# Patient Record
Sex: Female | Born: 1960 | Race: Black or African American | Hispanic: No | Marital: Married | Smoking: Never smoker
Health system: Southern US, Community
[De-identification: ages and names within clinical notes are randomized; demographics above are authoritative.]

## PROBLEM LIST (undated history)

## (undated) DIAGNOSIS — K810 Acute cholecystitis: Secondary | ICD-10-CM

## (undated) DIAGNOSIS — E785 Hyperlipidemia, unspecified: Secondary | ICD-10-CM

## (undated) DIAGNOSIS — K219 Gastro-esophageal reflux disease without esophagitis: Secondary | ICD-10-CM

## (undated) DIAGNOSIS — I10 Essential (primary) hypertension: Secondary | ICD-10-CM

## (undated) HISTORY — PX: TUBAL LIGATION: SHX77

---

## 2018-10-22 ENCOUNTER — Encounter (HOSPITAL_COMMUNITY): Payer: Self-pay | Admitting: Internal Medicine

## 2018-10-22 ENCOUNTER — Inpatient Hospital Stay (HOSPITAL_COMMUNITY)
Admission: RE | Admit: 2018-10-22 | Discharge: 2018-10-25 | DRG: 419 | Disposition: A | Discharge status: Home / Self Care | Payer: BC Managed Care – PPO | Source: Other Acute Inpatient Hospital | Attending: Internal Medicine | Admitting: Internal Medicine

## 2018-10-22 DIAGNOSIS — K8050 Calculus of bile duct without cholangitis or cholecystitis without obstruction: Secondary | ICD-10-CM

## 2018-10-22 DIAGNOSIS — Z79899 Other long term (current) drug therapy: Secondary | ICD-10-CM

## 2018-10-22 DIAGNOSIS — Z885 Allergy status to narcotic agent status: Secondary | ICD-10-CM

## 2018-10-22 DIAGNOSIS — K660 Peritoneal adhesions (postprocedural) (postinfection): Secondary | ICD-10-CM | POA: Diagnosis present

## 2018-10-22 DIAGNOSIS — K8062 Calculus of gallbladder and bile duct with acute cholecystitis without obstruction: Principal | ICD-10-CM | POA: Diagnosis present

## 2018-10-22 DIAGNOSIS — Z20828 Contact with and (suspected) exposure to other viral communicable diseases: Secondary | ICD-10-CM | POA: Diagnosis present

## 2018-10-22 DIAGNOSIS — Z419 Encounter for procedure for purposes other than remedying health state, unspecified: Secondary | ICD-10-CM

## 2018-10-22 DIAGNOSIS — K810 Acute cholecystitis: Secondary | ICD-10-CM | POA: Diagnosis not present

## 2018-10-22 DIAGNOSIS — K828 Other specified diseases of gallbladder: Secondary | ICD-10-CM | POA: Diagnosis present

## 2018-10-22 DIAGNOSIS — K5909 Other constipation: Secondary | ICD-10-CM | POA: Diagnosis present

## 2018-10-22 DIAGNOSIS — K219 Gastro-esophageal reflux disease without esophagitis: Secondary | ICD-10-CM | POA: Diagnosis present

## 2018-10-22 DIAGNOSIS — R112 Nausea with vomiting, unspecified: Secondary | ICD-10-CM | POA: Diagnosis present

## 2018-10-22 DIAGNOSIS — I10 Essential (primary) hypertension: Secondary | ICD-10-CM | POA: Diagnosis present

## 2018-10-22 DIAGNOSIS — D649 Anemia, unspecified: Secondary | ICD-10-CM | POA: Diagnosis not present

## 2018-10-22 DIAGNOSIS — Z9851 Tubal ligation status: Secondary | ICD-10-CM | POA: Diagnosis not present

## 2018-10-22 DIAGNOSIS — K8042 Calculus of bile duct with acute cholecystitis without obstruction: Secondary | ICD-10-CM | POA: Diagnosis not present

## 2018-10-22 DIAGNOSIS — E785 Hyperlipidemia, unspecified: Secondary | ICD-10-CM | POA: Diagnosis present

## 2018-10-22 DIAGNOSIS — K819 Cholecystitis, unspecified: Secondary | ICD-10-CM

## 2018-10-22 DIAGNOSIS — K5900 Constipation, unspecified: Secondary | ICD-10-CM | POA: Diagnosis present

## 2018-10-22 HISTORY — DX: Hyperlipidemia, unspecified: E78.5

## 2018-10-22 HISTORY — DX: Acute cholecystitis: K81.0

## 2018-10-22 HISTORY — DX: Gastro-esophageal reflux disease without esophagitis: K21.9

## 2018-10-22 HISTORY — DX: Essential (primary) hypertension: I10

## 2018-10-22 LAB — CBC WITH DIFFERENTIAL/PLATELET
Abs Immature Granulocytes: 0.03 10*3/uL (ref 0.00–0.07)
Basophils Absolute: 0 10*3/uL (ref 0.0–0.1)
Basophils Relative: 0 %
Eosinophils Absolute: 0 10*3/uL (ref 0.0–0.5)
Eosinophils Relative: 0 %
HCT: 38.5 % (ref 36.0–46.0)
Hemoglobin: 13 g/dL (ref 12.0–15.0)
Immature Granulocytes: 0 %
Lymphocytes Relative: 15 %
Lymphs Abs: 1.2 10*3/uL (ref 0.7–4.0)
MCH: 32.2 pg (ref 26.0–34.0)
MCHC: 33.8 g/dL (ref 30.0–36.0)
MCV: 95.3 fL (ref 80.0–100.0)
Monocytes Absolute: 0.8 10*3/uL (ref 0.1–1.0)
Monocytes Relative: 9 %
Neutro Abs: 6.3 10*3/uL (ref 1.7–7.7)
Neutrophils Relative %: 76 %
Platelets: 226 10*3/uL (ref 150–400)
RBC: 4.04 MIL/uL (ref 3.87–5.11)
RDW: 13 % (ref 11.5–15.5)
WBC: 8.4 10*3/uL (ref 4.0–10.5)
nRBC: 0 % (ref 0.0–0.2)

## 2018-10-22 LAB — COMPREHENSIVE METABOLIC PANEL
ALT: 345 U/L — ABNORMAL HIGH (ref 0–44)
AST: 161 U/L — ABNORMAL HIGH (ref 15–41)
Albumin: 3.8 g/dL (ref 3.5–5.0)
Alkaline Phosphatase: 207 U/L — ABNORMAL HIGH (ref 38–126)
Anion gap: 12 (ref 5–15)
BUN: 10 mg/dL (ref 6–20)
CO2: 26 mmol/L (ref 22–32)
Calcium: 9.1 mg/dL (ref 8.9–10.3)
Chloride: 100 mmol/L (ref 98–111)
Creatinine, Ser: 0.96 mg/dL (ref 0.44–1.00)
GFR calc Af Amer: >60 mL/min (ref >60)
GFR calc non Af Amer: >60 mL/min (ref >60)
Glucose, Bld: 103 mg/dL — ABNORMAL HIGH (ref 70–99)
Potassium: 3.8 mmol/L (ref 3.5–5.1)
Sodium: 138 mmol/L (ref 135–145)
Total Bilirubin: 0.9 mg/dL (ref 0.3–1.2)
Total Protein: 6.5 g/dL (ref 6.5–8.1)

## 2018-10-22 LAB — HIV ANTIBODY (ROUTINE TESTING W REFLEX): HIV Screen 4th Generation wRfx: NONREACTIVE

## 2018-10-22 MED ORDER — ACETAMINOPHEN 325 MG PO TABS: 650.0000 mg | ORAL_TABLET | Freq: Four times a day (QID) | ORAL | Status: DC | PRN

## 2018-10-22 MED ORDER — PANTOPRAZOLE SODIUM 40 MG IV SOLR
40.0000 mg | INTRAVENOUS | Status: DC
  Administered 2018-10-22 – 2018-10-24 (×3): 40 mg via INTRAVENOUS
  Filled 2018-10-22 (×3): qty 40

## 2018-10-22 MED ORDER — SODIUM CHLORIDE 0.9 % IV SOLN
1.0000 g | Freq: Three times a day (TID) | INTRAVENOUS | Status: DC
  Administered 2018-10-22 – 2018-10-24 (×5): 1 g via INTRAVENOUS
  Filled 2018-10-22 (×9): qty 1

## 2018-10-22 MED ORDER — DEXTROSE-NACL 5-0.9 % IV SOLN
INTRAVENOUS | Status: DC
  Administered 2018-10-22 – 2018-10-25 (×6): via INTRAVENOUS

## 2018-10-22 MED ORDER — ONDANSETRON HCL 4 MG/2ML IJ SOLN
4.0000 mg | Freq: Four times a day (QID) | INTRAMUSCULAR | Status: DC | PRN
  Administered 2018-10-23 – 2018-10-24 (×2): 4 mg via INTRAVENOUS
  Filled 2018-10-22 (×2): qty 2

## 2018-10-22 MED ORDER — HEPARIN SODIUM (PORCINE) 5000 UNIT/ML IJ SOLN
5000.0000 [IU] | Freq: Three times a day (TID) | INTRAMUSCULAR | Status: DC
  Administered 2018-10-22 – 2018-10-23 (×2): 5000 [IU] via SUBCUTANEOUS
  Filled 2018-10-22 (×2): qty 1

## 2018-10-22 MED ORDER — ONDANSETRON HCL 4 MG PO TABS: 4.0000 mg | ORAL_TABLET | Freq: Four times a day (QID) | ORAL | Status: DC | PRN

## 2018-10-22 MED ORDER — MORPHINE SULFATE (PF) 2 MG/ML IV SOLN
2.0000 mg | INTRAVENOUS | Status: DC | PRN
  Administered 2018-10-23 – 2018-10-24 (×3): 2 mg via INTRAVENOUS
  Filled 2018-10-22 (×3): qty 1

## 2018-10-22 MED ORDER — ACETAMINOPHEN 650 MG RE SUPP: 650.0000 mg | Freq: Four times a day (QID) | RECTAL | Status: DC | PRN

## 2018-10-22 NOTE — Consult Note (Signed)
Reason for Consult: Cholecystitis Referring Physician: Jonelle Sidle MD   Note  Tina Mccoy is an 58 y.o. female.  HPI:  patient sent from [facility] rocking him and accepted by [facility] for acute cholecystitis.  She was evaluated there and was found to have elevated LFTs and a CT scan which showed acute cholecystitis.  She relates a 2-day history of abdominal pain, nausea and vomiting.  The pain is was in her epigastrium but now is better.  She had mild elevations of her LFTs.  She is pain-free currently.   History reviewed. No pertinent past medical history.  History reviewed. No pertinent surgical history.  History reviewed. No pertinent family history.  Social History:  reports that she has never smoked. She has never used smokeless tobacco. No history on file for alcohol and drug.  Allergies: Not on File  Medications: I have reviewed the patient's current medications.  Results for orders placed or performed during the hospital encounter of 10/22/18 (from the past 48 hour(s))  CBC with Differential/Platelet     Status: None   Collection Time: 10/22/18  7:25 PM  Result Value Ref Range   WBC 8.4 4.0 - 10.5 K/uL   RBC 4.04 3.87 - 5.11 MIL/uL   Hemoglobin 13.0 12.0 - 15.0 g/dL   HCT 38.5 36.0 - 46.0 %   MCV 95.3 80.0 - 100.0 fL   MCH 32.2 26.0 - 34.0 pg   MCHC 33.8 30.0 - 36.0 g/dL   RDW 13.0 11.5 - 15.5 %   Platelets 226 150 - 400 K/uL   nRBC 0.0 0.0 - 0.2 %   Neutrophils Relative % 76 %   Neutro Abs 6.3 1.7 - 7.7 K/uL   Lymphocytes Relative 15 %   Lymphs Abs 1.2 0.7 - 4.0 K/uL   Monocytes Relative 9 %   Monocytes Absolute 0.8 0.1 - 1.0 K/uL   Eosinophils Relative 0 %   Eosinophils Absolute 0.0 0.0 - 0.5 K/uL   Basophils Relative 0 %   Basophils Absolute 0.0 0.0 - 0.1 K/uL   Immature Granulocytes 0 %   Abs Immature Granulocytes 0.03 0.00 - 0.07 K/uL    Comment: Performed at [facility], [address], [place], [place] [address]  Comprehensive metabolic panel      Status: Abnormal   Collection Time: 10/22/18  7:25 PM  Result Value Ref Range   Sodium 138 135 - 145 mmol/L   Potassium 3.8 3.5 - 5.1 mmol/L   Chloride 100 98 - 111 mmol/L   CO2 26 22 - 32 mmol/L   Glucose, Bld 103 (H) 70 - 99 mg/dL   BUN 10 6 - 20 mg/dL   Creatinine, Ser 0.96 0.44 - 1.00 mg/dL   Calcium 9.1 8.9 - 10.3 mg/dL   Total Protein 6.5 6.5 - 8.1 g/dL   Albumin 3.8 3.5 - 5.0 g/dL   AST 161 (H) 15 - 41 U/L   ALT 345 (H) 0 - 44 U/L   Alkaline Phosphatase 207 (H) 38 - 126 U/L   Total Bilirubin 0.9 0.3 - 1.2 mg/dL   GFR calc non Af Amer >60 >60 mL/min   GFR calc Af Amer >60 >60 mL/min   Anion gap 12 5 - 15    Comment: Performed at [facility] [address], [place], [place] [address]  HIV Antibody (routine testing w rflx)     Status: None   Collection Time: 10/22/18  7:25 PM  Result Value Ref Range  HIV Screen 4th Generation wRfx NON REACTIVE NON REACTIVE    Comment: Performed at [facility], [address] [place], [place] [address]    No results found.  Review of Systems  Constitutional: Negative for fever.  Gastrointestinal: Positive for abdominal pain, nausea and vomiting.   Blood pressure (!) 147/87, pulse 65, temperature (!) 97.5 F (36.4 C), temperature source Oral, resp. rate 18, SpO2 99 %. Physical Exam  Constitutional: She is oriented to person, place, and time. She appears well-developed.  HENT:  Head: Normocephalic.  Eyes: No scleral icterus.  Neck: Normal range of motion.  Cardiovascular: Normal rate.  Respiratory: Effort normal.  GI: Soft. Bowel sounds are normal. There is abdominal tenderness.  Musculoskeletal: Normal range of motion.  Neurological: She is alert and oriented to person, place, and time.  Skin: Skin is warm and dry.    Assessment/Plan: Symptomatic cholelithiasis.  No clinical findings of acute cholecystitis currently.  Elevated LFTs.  MRCP pending.  We will follow-up in a.m. to determine timing of  cholecystectomy.  I would keep her n.p.o. after midnight for now.  [name] A Dennise Raabe 10/22/2018, 11:30 PM

## 2018-10-22 NOTE — H&P (Signed)
History and Physical   Tina Mccoy FYB:[id number] DOB: Mar 24, 1960 DOA: 10/22/2018  Referring MD/NP/PA: Benson Norway  PCP: Clinton Quant, MD   Outpatient Specialists: None   Patient coming from: [facility]  Chief Complaint: Abdominal pain nausea vomiting  HPI: Tina Mccoy is a 58 y.o. female with medical history significant of GERD, hypertension, hyperlipidemia who was sent over from [facility] rocking him after presentation with abdominal pain nausea and vomiting.  Patient was evaluated and found to have acute cholecystitis on CT scan.  Also common bile duct was dilated with elevated bilirubin and alkaline phosphatase.  Lipase is within normal.  No fever and no leukocytosis.  Patient was recommended to have MRCP, surgical consultation and possibly gastroenterology consultation which is not available at [facility] rocking him.  Also ultrasound not readily available.  She was therefore sent here for further evaluation..   Review of Systems: As per HPI otherwise 10 point review of systems negative.    History reviewed. No pertinent past medical history.  History reviewed. No pertinent surgical history.   has no history on file for tobacco, alcohol, and drug.  Not on File  History reviewed. No pertinent family history.   Prior to Admission medications   Not on File    Physical Exam: Vitals:   10/22/18 1715 10/22/18 2153  BP: (!) 162/84 (!) 147/87  Pulse: 64 65  Resp: 17 18  Temp: 98.1 F (36.7 C) (!) 97.5 F (36.4 C)  TempSrc: Oral Oral  SpO2: 100% 99%      Constitutional: NAD, calm, comfortable Vitals:   10/22/18 1715 10/22/18 2153  BP: (!) 162/84 (!) 147/87  Pulse: 64 65  Resp: 17 18  Temp: 98.1 F (36.7 C) (!) 97.5 F (36.4 C)  TempSrc: Oral Oral  SpO2: 100% 99%   Eyes: PERRL, lids and conjunctivae normal ENMT: Mucous membranes are moist. Posterior pharynx clear of any exudate or lesions.Normal dentition.  Neck: normal, supple, no masses, no  thyromegaly Respiratory: clear to auscultation bilaterally, no wheezing, no crackles. Normal respiratory effort. No accessory muscle use.  Cardiovascular: Regular rate and rhythm, no murmurs / rubs / gallops. No extremity edema. 2+ pedal pulses. No carotid bruits.  Abdomen: Right upper quadrant and epigastric tenderness, no masses palpated. No hepatosplenomegaly. Bowel sounds positive.  Musculoskeletal: no clubbing / cyanosis. No joint deformity upper and lower extremities. Good ROM, no contractures. Normal muscle tone.  Skin: no rashes, lesions, ulcers. No induration Neurologic: CN 2-12 grossly intact. Sensation intact, DTR normal. Strength 5/5 in all 4.  Psychiatric: Normal judgment and insight. Alert and oriented x 3. Normal mood.     Labs on Admission: I have personally reviewed following labs and imaging studies  CBC: Recent Labs  Lab 10/22/18 1925  WBC 8.4  NEUTROABS 6.3  HGB 13.0  HCT 38.5  MCV 95.3  PLT 527   Basic Metabolic Panel: Recent Labs  Lab 10/22/18 1925  NA 138  K 3.8  CL 100  CO2 26  GLUCOSE 103*  BUN 10  CREATININE 0.96  CALCIUM 9.1   GFR: CrCl cannot be calculated (Unknown ideal weight.). Liver Function Tests: Recent Labs  Lab 10/22/18 1925  AST 161*  ALT 345*  ALKPHOS 207*  BILITOT 0.9  PROT 6.5  ALBUMIN 3.8   No results for input(s): LIPASE, AMYLASE in the last 168 hours. No results for input(s): AMMONIA in the last 168 hours. Coagulation Profile: No results for input(s): INR, PROTIME in the last 168 hours. Cardiac Enzymes: No  results for input(s): CKTOTAL, CKMB, CKMBINDEX, TROPONINI in the last 168 hours. BNP (last 3 results) No results for input(s): PROBNP in the last 8760 hours. HbA1C: No results for input(s): HGBA1C in the last 72 hours. CBG: No results for input(s): GLUCAP in the last 168 hours. Lipid Profile: No results for input(s): CHOL, HDL, LDLCALC, TRIG, CHOLHDL, LDLDIRECT in the last 72 hours. Thyroid Function Tests:  No results for input(s): TSH, T4TOTAL, FREET4, T3FREE, THYROIDAB in the last 72 hours. Anemia Panel: No results for input(s): VITAMINB12, FOLATE, FERRITIN, TIBC, IRON, RETICCTPCT in the last 72 hours. Urine analysis: No results found for: COLORURINE, APPEARANCEUR, LABSPEC, PHURINE, GLUCOSEU, HGBUR, BILIRUBINUR, KETONESUR, PROTEINUR, UROBILINOGEN, NITRITE, LEUKOCYTESUR Sepsis Labs: @LABRCNTIP (procalcitonin:4,lacticidven:4) )No results found for this or any previous visit (from the past 240 hour(s)).   Radiological Exams on Admission: No results found.   Assessment/Plan Principal Problem:   Acute cholecystitis Active Problems:   GERD (gastroesophageal reflux disease)   Benign essential HTN     #1 acute cholecystitis: No fever or chills, no nausea vomiting or diarrhea.  Patient is hemodynamically stable.  Initiate IV antibiotics.  MRCP ordered.  Right upper quadrant abdominal ultrasound.  Surgical consult and possibly GI consult in the morning.  #2 GERD: Continue with PPIs  #3 benign essential hypertension: Continue treatment with home regimen   DVT prophylaxis: Heparin Code Status: Full code Family Communication: No family at bedside Disposition Plan: Home Consults called: General surgery Dr. Admission status: Inpatient  Severity of Illness: The appropriate patient status for this patient is INPATIENT. Inpatient status is judged to be reasonable and necessary in order to provide the required intensity of service to ensure the patient's safety. The patient's presenting symptoms, physical exam findings, and initial radiographic and laboratory data in the context of their chronic comorbidities is felt to place them at high risk for further clinical deterioration. Furthermore, it is not anticipated that the patient will be medically stable for discharge from the hospital within 2 midnights of admission. The following factors support the patient status of inpatient.    " The patient's presenting symptoms include abdominal pain. " The worrisome physical exam findings include abdominal tenderness. " The initial radiographic and laboratory data are worrisome because of CT showing cholecystitis. " The chronic co-morbidities include GERD.   * I certify that at the point of admission it is my clinical judgment that the patient will require inpatient hospital care spanning beyond 2 midnights from the point of admission due to high intensity of service, high risk for further deterioration and high frequency of surveillance required.Harriette Bouillon MD [facility] Pager [phone]  If 7PM-7AM, please contact night-coverage [email or web address] Password [email or web address]  10/22/2018, 11:01 PM

## 2018-10-22 NOTE — Progress Notes (Signed)
Pt arrived to 6N-06 via stretcher, AOx4, VSS, ambulatory, no reports of pain. Oriented to room, call bell, phone, use of bed controls. Will continue to monitor.

## 2018-10-23 ENCOUNTER — Inpatient Hospital Stay (HOSPITAL_COMMUNITY): Payer: BC Managed Care – PPO

## 2018-10-23 ENCOUNTER — Encounter (HOSPITAL_COMMUNITY): Payer: Self-pay | Admitting: Certified Registered Nurse Anesthetist

## 2018-10-23 ENCOUNTER — Encounter (HOSPITAL_COMMUNITY)
Admission: RE | Disposition: A | Discharge status: Home / Self Care | Payer: Self-pay | Source: Other Acute Inpatient Hospital | Attending: Internal Medicine

## 2018-10-23 ENCOUNTER — Inpatient Hospital Stay (HOSPITAL_COMMUNITY): Payer: BC Managed Care – PPO | Admitting: Certified Registered Nurse Anesthetist

## 2018-10-23 DIAGNOSIS — K8042 Calculus of bile duct with acute cholecystitis without obstruction: Secondary | ICD-10-CM

## 2018-10-23 HISTORY — PX: CHOLECYSTECTOMY: SHX55

## 2018-10-23 LAB — COMPREHENSIVE METABOLIC PANEL
ALT: 293 U/L — ABNORMAL HIGH (ref 0–44)
AST: 138 U/L — ABNORMAL HIGH (ref 15–41)
Albumin: 3.5 g/dL (ref 3.5–5.0)
Alkaline Phosphatase: 219 U/L — ABNORMAL HIGH (ref 38–126)
Anion gap: 8 (ref 5–15)
BUN: 10 mg/dL (ref 6–20)
CO2: 26 mmol/L (ref 22–32)
Calcium: 8.6 mg/dL — ABNORMAL LOW (ref 8.9–10.3)
Chloride: 104 mmol/L (ref 98–111)
Creatinine, Ser: 0.85 mg/dL (ref 0.44–1.00)
GFR calc Af Amer: >60 mL/min (ref >60)
GFR calc non Af Amer: >60 mL/min (ref >60)
Glucose, Bld: 99 mg/dL (ref 70–99)
Potassium: 3.4 mmol/L — ABNORMAL LOW (ref 3.5–5.1)
Sodium: 138 mmol/L (ref 135–145)
Total Bilirubin: 0.9 mg/dL (ref 0.3–1.2)
Total Protein: 6.4 g/dL — ABNORMAL LOW (ref 6.5–8.1)

## 2018-10-23 LAB — CBC
HCT: 36.5 % (ref 36.0–46.0)
Hemoglobin: 12.3 g/dL (ref 12.0–15.0)
MCH: 31.9 pg (ref 26.0–34.0)
MCHC: 33.7 g/dL (ref 30.0–36.0)
MCV: 94.6 fL (ref 80.0–100.0)
Platelets: 209 10*3/uL (ref 150–400)
RBC: 3.86 MIL/uL — ABNORMAL LOW (ref 3.87–5.11)
RDW: 12.8 % (ref 11.5–15.5)
WBC: 6.2 10*3/uL (ref 4.0–10.5)
nRBC: 0 % (ref 0.0–0.2)

## 2018-10-23 LAB — SARS CORONAVIRUS 2 (TAT 6-24 HRS): SARS Coronavirus 2: NEGATIVE

## 2018-10-23 SURGERY — LAPAROSCOPIC CHOLECYSTECTOMY WITH INTRAOPERATIVE CHOLANGIOGRAM
Anesthesia: General | Site: Abdomen

## 2018-10-23 MED ORDER — FENTANYL CITRATE (PF) 250 MCG/5ML IJ SOLN
INTRAMUSCULAR | Status: AC
  Filled 2018-10-23: qty 5

## 2018-10-23 MED ORDER — PROPOFOL 10 MG/ML IV BOLUS
INTRAVENOUS | Status: DC | PRN
  Administered 2018-10-23: 100 mg via INTRAVENOUS
  Administered 2018-10-23: 20 mg via INTRAVENOUS

## 2018-10-23 MED ORDER — ONDANSETRON HCL 4 MG/2ML IJ SOLN
INTRAMUSCULAR | Status: AC
  Filled 2018-10-23: qty 2

## 2018-10-23 MED ORDER — 0.9 % SODIUM CHLORIDE (POUR BTL) OPTIME
TOPICAL | Status: DC | PRN
  Administered 2018-10-23: 1000 mL

## 2018-10-23 MED ORDER — EPHEDRINE SULFATE-NACL 50-0.9 MG/10ML-% IV SOSY
PREFILLED_SYRINGE | INTRAVENOUS | Status: DC | PRN
  Administered 2018-10-23: 10 mg via INTRAVENOUS

## 2018-10-23 MED ORDER — GLUCAGON HCL RDNA (DIAGNOSTIC) 1 MG IJ SOLR
INTRAMUSCULAR | Status: DC | PRN
  Administered 2018-10-23: 1 mg via INTRAVENOUS

## 2018-10-23 MED ORDER — GLUCAGON HCL RDNA (DIAGNOSTIC) 1 MG IJ SOLR
INTRAMUSCULAR | Status: AC
  Filled 2018-10-23: qty 1

## 2018-10-23 MED ORDER — PHENYLEPHRINE 40 MCG/ML (10ML) SYRINGE FOR IV PUSH (FOR BLOOD PRESSURE SUPPORT)
PREFILLED_SYRINGE | INTRAVENOUS | Status: AC
  Filled 2018-10-23: qty 10

## 2018-10-23 MED ORDER — SUGAMMADEX SODIUM 200 MG/2ML IV SOLN
INTRAVENOUS | Status: DC | PRN
  Administered 2018-10-23: 200 mg via INTRAVENOUS

## 2018-10-23 MED ORDER — LIDOCAINE 2% (20 MG/ML) 5 ML SYRINGE
INTRAMUSCULAR | Status: DC | PRN
  Administered 2018-10-23: 100 mg via INTRAVENOUS

## 2018-10-23 MED ORDER — GADOBUTROL 1 MMOL/ML IV SOLN
7.0000 mL | Freq: Once | INTRAVENOUS | Status: AC | PRN
  Administered 2018-10-23: 7 mL via INTRAVENOUS

## 2018-10-23 MED ORDER — MEPERIDINE HCL 25 MG/ML IJ SOLN: 6.2500 mg | INTRAMUSCULAR | Status: DC | PRN

## 2018-10-23 MED ORDER — BUPIVACAINE-EPINEPHRINE 0.25% -1:200000 IJ SOLN
INTRAMUSCULAR | Status: DC | PRN
  Administered 2018-10-23: 13 mL

## 2018-10-23 MED ORDER — OXYCODONE HCL 5 MG PO TABS
5.0000 mg | ORAL_TABLET | ORAL | Status: DC | PRN
  Administered 2018-10-23: 5 mg via ORAL
  Filled 2018-10-23: qty 1

## 2018-10-23 MED ORDER — SODIUM CHLORIDE 0.9 % IR SOLN
Status: DC | PRN
  Administered 2018-10-23 (×2): 1000 mL

## 2018-10-23 MED ORDER — GLYCOPYRROLATE PF 0.2 MG/ML IJ SOSY
PREFILLED_SYRINGE | INTRAMUSCULAR | Status: DC | PRN
  Administered 2018-10-23: .2 mg via INTRAVENOUS

## 2018-10-23 MED ORDER — STERILE WATER FOR IRRIGATION IR SOLN
Status: DC | PRN
  Administered 2018-10-23: 1000 mL

## 2018-10-23 MED ORDER — BUPIVACAINE-EPINEPHRINE 0.5% -1:200000 IJ SOLN
INTRAMUSCULAR | Status: AC
  Filled 2018-10-23: qty 1

## 2018-10-23 MED ORDER — FENTANYL CITRATE (PF) 250 MCG/5ML IJ SOLN
INTRAMUSCULAR | Status: DC | PRN
  Administered 2018-10-23: 50 ug via INTRAVENOUS
  Administered 2018-10-23: 150 ug via INTRAVENOUS
  Administered 2018-10-23: 50 ug via INTRAVENOUS

## 2018-10-23 MED ORDER — DEXAMETHASONE SODIUM PHOSPHATE 10 MG/ML IJ SOLN
INTRAMUSCULAR | Status: AC
  Filled 2018-10-23: qty 1

## 2018-10-23 MED ORDER — MIDAZOLAM HCL 2 MG/2ML IJ SOLN
INTRAMUSCULAR | Status: AC
  Filled 2018-10-23: qty 2

## 2018-10-23 MED ORDER — ONDANSETRON HCL 4 MG/2ML IJ SOLN
INTRAMUSCULAR | Status: DC | PRN
  Administered 2018-10-23: 4 mg via INTRAVENOUS

## 2018-10-23 MED ORDER — SODIUM CHLORIDE (PF) 0.9 % IJ SOLN
INTRAMUSCULAR | Status: DC | PRN
  Administered 2018-10-23: 20 mL

## 2018-10-23 MED ORDER — ONDANSETRON HCL 4 MG/2ML IJ SOLN: 4.0000 mg | Freq: Once | INTRAMUSCULAR | Status: DC | PRN

## 2018-10-23 MED ORDER — HYDROMORPHONE HCL 1 MG/ML IJ SOLN: 0.2500 mg | INTRAMUSCULAR | Status: DC | PRN

## 2018-10-23 MED ORDER — HEMOSTATIC AGENTS (NO CHARGE) OPTIME
TOPICAL | Status: DC | PRN
  Administered 2018-10-23: 1 via TOPICAL

## 2018-10-23 MED ORDER — ROCURONIUM BROMIDE 10 MG/ML (PF) SYRINGE
PREFILLED_SYRINGE | INTRAVENOUS | Status: DC | PRN
  Administered 2018-10-23: 20 mg via INTRAVENOUS
  Administered 2018-10-23: 60 mg via INTRAVENOUS

## 2018-10-23 MED ORDER — MIDAZOLAM HCL 2 MG/2ML IJ SOLN
INTRAMUSCULAR | Status: DC | PRN
  Administered 2018-10-23: 2 mg via INTRAVENOUS

## 2018-10-23 MED ORDER — PHENYLEPHRINE 40 MCG/ML (10ML) SYRINGE FOR IV PUSH (FOR BLOOD PRESSURE SUPPORT)
PREFILLED_SYRINGE | INTRAVENOUS | Status: DC | PRN
  Administered 2018-10-23 (×2): 80 ug via INTRAVENOUS

## 2018-10-23 MED ORDER — LORAZEPAM 2 MG/ML IJ SOLN
1.0000 mg | Freq: Once | INTRAMUSCULAR | Status: AC
  Administered 2018-10-23: 1 mg via INTRAVENOUS
  Filled 2018-10-23: qty 1

## 2018-10-23 MED ORDER — EPHEDRINE 5 MG/ML INJ
INTRAVENOUS | Status: AC
  Filled 2018-10-23: qty 10

## 2018-10-23 MED ORDER — LIDOCAINE 2% (20 MG/ML) 5 ML SYRINGE
INTRAMUSCULAR | Status: AC
  Filled 2018-10-23: qty 5

## 2018-10-23 MED ORDER — LACTATED RINGERS IV SOLN
INTRAVENOUS | Status: DC
  Administered 2018-10-23 (×2): via INTRAVENOUS

## 2018-10-23 MED ORDER — DEXAMETHASONE SODIUM PHOSPHATE 10 MG/ML IJ SOLN
INTRAMUSCULAR | Status: DC | PRN
  Administered 2018-10-23: 5 mg via INTRAVENOUS

## 2018-10-23 MED ORDER — ROCURONIUM BROMIDE 10 MG/ML (PF) SYRINGE
PREFILLED_SYRINGE | INTRAVENOUS | Status: AC
  Filled 2018-10-23: qty 10

## 2018-10-23 SURGICAL SUPPLY — 45 items
APPLIER CLIP ROT 10 11.4 M/L (STAPLE) ×3
BENZOIN TINCTURE PRP APPL 2/3 (GAUZE/BANDAGES/DRESSINGS) ×3 IMPLANT
BLADE CLIPPER SURG (BLADE) IMPLANT
CANISTER SUCT 3000ML PPV (MISCELLANEOUS) ×3 IMPLANT
CHLORAPREP W/TINT 26 (MISCELLANEOUS) ×3 IMPLANT
CLIP APPLIE ROT 10 11.4 M/L (STAPLE) ×1 IMPLANT
CLOSURE WOUND 1/2 X4 (GAUZE/BANDAGES/DRESSINGS) ×1
COVER MAYO STAND STRL (DRAPES) ×3 IMPLANT
COVER SURGICAL LIGHT HANDLE (MISCELLANEOUS) ×3 IMPLANT
COVER WAND RF STERILE (DRAPES) IMPLANT
DRAPE C-ARM 42X72 X-RAY (DRAPES) ×3 IMPLANT
DRSG TEGADERM 2-3/8X2-3/4 SM (GAUZE/BANDAGES/DRESSINGS) ×3 IMPLANT
DRSG TEGADERM 4X4.75 (GAUZE/BANDAGES/DRESSINGS) ×3 IMPLANT
ELECT REM PT RETURN 9FT ADLT (ELECTROSURGICAL) ×3
ELECTRODE REM PT RTRN 9FT ADLT (ELECTROSURGICAL) ×1 IMPLANT
GAUZE SPONGE 2X2 8PLY STRL LF (GAUZE/BANDAGES/DRESSINGS) ×1 IMPLANT
GLOVE BIO SURGEON STRL SZ7 (GLOVE) ×3 IMPLANT
GLOVE BIOGEL PI IND STRL 7.5 (GLOVE) ×1 IMPLANT
GLOVE BIOGEL PI INDICATOR 7.5 (GLOVE) ×2
GOWN STRL REUS W/ TWL LRG LVL3 (GOWN DISPOSABLE) ×3 IMPLANT
GOWN STRL REUS W/TWL LRG LVL3 (GOWN DISPOSABLE) ×6
KIT BASIN OR (CUSTOM PROCEDURE TRAY) ×3 IMPLANT
KIT TURNOVER KIT B (KITS) ×3 IMPLANT
NS IRRIG 1000ML POUR BTL (IV SOLUTION) ×3 IMPLANT
PAD ARMBOARD 7.5X6 YLW CONV (MISCELLANEOUS) ×3 IMPLANT
POUCH RETRIEVAL ECOSAC 10 (ENDOMECHANICALS) IMPLANT
POUCH RETRIEVAL ECOSAC 10MM (ENDOMECHANICALS)
POUCH RETRIEVAL MASTER ESAC 10 (ENDOMECHANICALS) ×3 IMPLANT
POUCH SPECIMEN RETRIEVAL 10MM (ENDOMECHANICALS) IMPLANT
SCISSORS LAP 5X35 DISP (ENDOMECHANICALS) ×3 IMPLANT
SET CHOLANGIOGRAPH 5 50 .035 (SET/KITS/TRAYS/PACK) ×3 IMPLANT
SET IRRIG TUBING LAPAROSCOPIC (IRRIGATION / IRRIGATOR) ×3 IMPLANT
SET TUBE SMOKE EVAC HIGH FLOW (TUBING) ×3 IMPLANT
SLEEVE ENDOPATH XCEL 5M (ENDOMECHANICALS) ×3 IMPLANT
SPECIMEN JAR SMALL (MISCELLANEOUS) ×3 IMPLANT
SPONGE GAUZE 2X2 STER 10/PKG (GAUZE/BANDAGES/DRESSINGS) ×2
STRIP CLOSURE SKIN 1/2X4 (GAUZE/BANDAGES/DRESSINGS) ×2 IMPLANT
SUT MNCRL AB 4-0 PS2 18 (SUTURE) ×3 IMPLANT
TOWEL GREEN STERILE (TOWEL DISPOSABLE) ×3 IMPLANT
TOWEL GREEN STERILE FF (TOWEL DISPOSABLE) ×3 IMPLANT
TRAY LAPAROSCOPIC MC (CUSTOM PROCEDURE TRAY) ×3 IMPLANT
TROCAR XCEL BLUNT TIP 100MML (ENDOMECHANICALS) ×3 IMPLANT
TROCAR XCEL NON-BLD 11X100MML (ENDOMECHANICALS) ×3 IMPLANT
TROCAR XCEL NON-BLD 5MMX100MML (ENDOMECHANICALS) ×3 IMPLANT
WATER STERILE IRR 1000ML POUR (IV SOLUTION) ×3 IMPLANT

## 2018-10-23 NOTE — Anesthesia Postprocedure Evaluation (Signed)
Anesthesia Post Note  Patient: Tina Mccoy  Procedure(s) Performed: LAPAROSCOPIC CHOLECYSTECTOMY WITH INTRAOPERATIVE CHOLANGIOGRAM (N/A Abdomen)     Patient location during evaluation: PACU Anesthesia Type: General Level of consciousness: awake and alert Pain management: pain level controlled Vital Signs Assessment: post-procedure vital signs reviewed and stable Respiratory status: spontaneous breathing, nonlabored ventilation and respiratory function stable Cardiovascular status: blood pressure returned to baseline and stable Postop Assessment: no apparent nausea or vomiting Anesthetic complications: no    Last Vitals:  Vitals:   10/23/18 0505 10/23/18 1520  BP: 116/72   Pulse: 72   Resp: 18   Temp: 36.8 C (!) 36.2 C  SpO2: 98%     Last Pain:  Vitals:   10/23/18 1200  TempSrc:   PainSc: 0-No pain                 Catalina Gravel

## 2018-10-23 NOTE — Progress Notes (Signed)
[facility] [place] [facility] Progress Note      Assessment/Plan  Acute cholecystitis - [place] today showed wall thickening and small amount of pericholecystic fluid, negative sonographic Murphy sign, CBD 10.2 mm - MRCP today showed 3 mm round lesion in the distal CBD - LFT's trending down, Tbili 0.9 - We will plan for lap chole with IOC this admission.  Will discuss timing with MD.  May need GI consult pending IOC results or prior to OR. - We will follow  FEN: N.p.o., IV fluids VTE: SCD's, heparin ID: Merrem 10/11>> Foley: None Follow up: TBD    LOS: 1 day    Subjective: CC: Abdominal pain  Pain is greatly improved and is only mild at this time.  No nausea, vomiting, fever or chills overnight.  Patient is thankful for her attentive care.  Discussed results of MRCP.   Objective: Vital signs in last 24 hours: Temp:  [97.5 F (36.4 C)-98.2 F (36.8 C)] 98.2 F (36.8 C) (10/12 0505) Pulse Rate:  [64-72] 72 (10/12 0505) Resp:  [17-18] 18 (10/12 0505) BP: (116-162)/(72-87) 116/72 (10/12 0505) SpO2:  [98 %-100 %] 98 % (10/12 0505) Last BM Date: 10/21/18  Intake/Output from previous day: No intake/output data recorded. Intake/Output this shift: No intake/output data recorded.  PE: Gen:  Alert, NAD, pleasant, cooperative Pulm: Right and effort normal Abd: Soft, ND, no HSM, mild generalized TTP without guarding.  No Murphy sign.  No peritonitis Skin: no rashes noted, warm and dry   Anti-infectives: Anti-infectives (From admission, onward)   Start     Dose/Rate Route Frequency Ordered Stop   10/22/18 2000  meropenem (MERREM) 1 g in sodium chloride 0.9 % 100 mL IVPB     1 g 200 mL/hr over 30 Minutes Intravenous Every 8 hours 10/22/18 1927        Lab Results:  Recent Labs    10/22/18 1925 10/23/18 0334  WBC 8.4 6.2  HGB 13.0 12.3  HCT 38.5 36.5  PLT 226 209   BMET Recent Labs    10/22/18 1925 10/23/18 0334  NA 138 138  K 3.8 3.4*  CL 100 104  CO2 26  26  GLUCOSE 103* 99  BUN 10 10  CREATININE 0.96 0.85  CALCIUM 9.1 8.6*   PT/INR No results for input(s): LABPROT, INR in the last 72 hours. CMP     Component Value Date/Time   NA 138 10/23/2018 0334   K 3.4 (L) 10/23/2018 0334   CL 104 10/23/2018 0334   CO2 26 10/23/2018 0334   GLUCOSE 99 10/23/2018 0334   BUN 10 10/23/2018 0334   CREATININE 0.85 10/23/2018 0334   CALCIUM 8.6 (L) 10/23/2018 0334   PROT 6.4 (L) 10/23/2018 0334   ALBUMIN 3.5 10/23/2018 0334   AST 138 (H) 10/23/2018 0334   ALT 293 (H) 10/23/2018 0334   ALKPHOS 219 (H) 10/23/2018 0334   BILITOT 0.9 10/23/2018 0334   GFRNONAA >60 10/23/2018 0334   GFRAA >60 10/23/2018 0334   Lipase  No results found for: LIPASE  Studies/Results: Mr Abdomen Mrcp W Wo Contast  Result Date: 10/23/2018 CLINICAL DATA:  7 mL Gadavist and found to have acute cholecystitis on CT scan. Also common bile duct was dilated with elevated bilirubin and alkaline phosphatase.^2mL GADAVIST GADOBUTROL 1 MMOL/ML IV SOLNCholecystitis / cholangitis, known, follow up EXAM: MRI ABDOMEN WITHOUT AND WITH CONTRAST (INCLUDING MRCP) TECHNIQUE: Multiplanar multisequence MR imaging of the abdomen was performed both before and after the administration of intravenous contrast. Heavily T2-weighted  images of the biliary and pancreatic ducts were obtained, and three-dimensional MRCP images were rendered by post processing. CONTRAST:  86mL GADAVIST GADOBUTROL 1 MMOL/ML IV SOLN COMPARISON:  CT 10/21/2018, ultrasound 10/23/2018 FINDINGS: Lower chest:  Lung bases are clear. Hepatobiliary: Multiple gallstones within lumen the gallbladder. Several small stones in the fundus and a large 1.8 cm stone towards the neck region. There is pericholecystic fluid present. The gallbladder is minimally distended 1.5 cm. The common hepatic duct and common bile duct are upper limits of normal at 6 mm. Within the distal common bile duct there is a round the signal abnormality on T2 weighted  imaging (image 21/4) which is low signal intensity and measures 3 mm. This is seen on additional T2 weighted sequence (image 21/7). Lesion is not identified on the heavily T2 weighted MRCP sequences or T1 weighted imaging. Pancreas: Normal pancreatic parenchymal intensity. No ductal dilatation or inflammation. No pancreatic ductal variation. Spleen: Normal spleen. Adrenals/urinary tract: Adrenal glands and kidneys are normal. Stomach/Bowel: Stomach and limited of the small bowel is unremarkable Vascular/Lymphatic: Abdominal aortic normal caliber. No retroperitoneal periportal lymphadenopathy. Musculoskeletal: No aggressive osseous lesion IMPRESSION: 1. Small 3 mm round lesion in the distal common bile duct seen on the axial T2 weighted imaging but not on MRCP sequences. Findings concerning for but not definitive for a small distal common bile duct stone. No intrahepatic or extrahepatic biliary duct dilatation. 2. Multiple gallstones within the lumen of the gallbladder and gallbladder wall thickening with pericholecystic fluid corresponds to findings on comparison CT and ultrasound. Electronically Signed   By: Genevive Bi M.D.   On: 10/23/2018 08:15   US Abdomen Limited Ruq  Result Date: 10/23/2018 CLINICAL DATA:  Abnormal CT abdominal pain with nausea and vomiting EXAM: ULTRASOUND ABDOMEN LIMITED RIGHT UPPER QUADRANT COMPARISON:  CT 10/21/2018 FINDINGS: Gallbladder: Numerous shadowing stones measuring up to 1.3 cm. Increased wall thickness up to 8.7 mm. Negative sonographic Murphy. Edema in the gallbladder wall versus trace pericholecystic fluid. Common bile duct: Diameter: Maximum diameter of 10.2 mm.  No shadowing stones. Liver: No focal lesion identified. Within normal limits in parenchymal echogenicity. Portal vein is patent on color Doppler imaging with normal direction of blood flow towards the liver. Other: None. IMPRESSION: 1. Cholelithiasis with wall thickening and gallbladder wall edema or small  amount of pericholecystic fluid but negative sonographic Murphy. Findings still raise concern for an acute cholecystitis; nuclear medicine hepatobiliary imaging could be obtained further evaluation 2. Dilated common bile duct up to 10.2 mm. If ductal stone is suspected, MRCP could be obtained. Electronically Signed   By: Jasmine Pang M.D.   On: 10/23/2018 00:48     Jerre Simon, [facility] Pager [phone] Horald Chestnut, & Friday 7:00am - 4:30pm Thursdays 7:00am -11:30am

## 2018-10-23 NOTE — Transfer of Care (Signed)
Immediate Anesthesia Transfer of Care Note  Patient: Tina Mccoy  Procedure(s) Performed: LAPAROSCOPIC CHOLECYSTECTOMY WITH INTRAOPERATIVE CHOLANGIOGRAM (N/A Abdomen)  Patient Location: PACU  Anesthesia Type:General  Level of Consciousness: awake, alert  and oriented  Airway & Oxygen Therapy: Patient Spontanous Breathing and Patient connected to face mask oxygen  Post-op Assessment: Report given to RN and Post -op Vital signs reviewed and stable  Post vital signs: Reviewed and stable  Last Vitals:  Vitals Value Taken Time  BP 105/56 10/23/18 1519  Temp    Pulse 81 10/23/18 1527  Resp 13 10/23/18 1527  SpO2 99 % 10/23/18 1527  Vitals shown include unvalidated device data.  Last Pain:  Vitals:   10/23/18 1200  TempSrc:   PainSc: 0-No pain         Complications: No apparent anesthesia complications

## 2018-10-23 NOTE — Anesthesia Procedure Notes (Signed)
Procedure Name: Intubation Date/Time: 10/23/2018 1:53 PM Performed by: Alain Marion, CRNA Pre-anesthesia Checklist: Patient identified, Emergency Drugs available, Suction available and Patient being monitored Patient Re-evaluated:Patient Re-evaluated prior to induction Oxygen Delivery Method: Circle System Utilized Preoxygenation: Pre-oxygenation with 100% oxygen Induction Type: IV induction Ventilation: Mask ventilation without difficulty Laryngoscope Size: Miller and 2 Grade View: Grade I Tube type: Oral Tube size: 7.0 mm Number of attempts: 1 Airway Equipment and Method: Stylet Placement Confirmation: ETT inserted through vocal cords under direct vision,  positive ETCO2 and breath sounds checked- equal and bilateral Secured at: 20 cm Tube secured with: Tape Dental Injury: Teeth and Oropharynx as per pre-operative assessment

## 2018-10-23 NOTE — Anesthesia Preprocedure Evaluation (Signed)
Anesthesia Evaluation  Patient identified by MRN, date of birth, ID band Patient awake    Reviewed: Allergy & Precautions, NPO status , Patient's Chart, lab work & pertinent test results  Airway Mallampati: I  TM Distance: >3 FB Neck ROM: Full    Dental   Pulmonary    Pulmonary exam normal        Cardiovascular hypertension, Pt. on medications Normal cardiovascular exam     Neuro/Psych    GI/Hepatic GERD  Medicated and Controlled,  Endo/Other    Renal/GU      Musculoskeletal   Abdominal   Peds  Hematology   Anesthesia Other Findings   Reproductive/Obstetrics                             Anesthesia Physical Anesthesia Plan  ASA: II  Anesthesia Plan: General   Post-op Pain Management:    Induction: Intravenous  PONV Risk Score and Plan: 3 and Ondansetron, Treatment may vary due to age or medical condition and Midazolam  Airway Management Planned: Oral ETT  Additional Equipment:   Intra-op Plan:   Post-operative Plan: Extubation in OR  Informed Consent: I have reviewed the patients History and Physical, chart, labs and discussed the procedure including the risks, benefits and alternatives for the proposed anesthesia with the patient or authorized representative who has indicated his/her understanding and acceptance.       Plan Discussed with: CRNA and Surgeon  Anesthesia Plan Comments:         Anesthesia Quick Evaluation  

## 2018-10-23 NOTE — Consult Note (Addendum)
Referring Provider:   [facility]         Primary Care Physician:  Pomposini, Rande Bruntaniel L, MD Primary Gastroenterologist:  Dr. Samuella CotaPandya           Reason for Consultation:  Common bile duct stone                 ASSESSMENT /  PLAN    1. 58 yo female with cholelithiasis / acute cholecystitis s/p laparoscopic cholecystectomy today.   2. Choledocholithiasis, cholangiogram revealing 3mm distal CBD stone. Biliary duct dilation of imaging / liver tests elevated as expected.   -she will need ERCP with CBD stone extraction. I briefly discussed this with her today but she is still sleepy from anesthesia.  -We will not be able to get to procedure tomorrow due to full schedule ( unless becomes urgent). Will see again tomorrow to discuss.  -am liver tests   Agree with Tina Mccoy's assessment and plan.  Small distal CBD stone.  Unfortunately I do not think I will be able to do an ERCP tomorrow.  I may be able to get it set up for Wed 0730.  Will work on that   Iva Booparl E. Gessner, MD, Clementeen GrahamFACG     HPI:     Tina Mccoy is a 58 y.o. female with a pmh significant for HTN, hyperlipidemia, and GERD. [facility] with acute cholecystitis. Liver tests were abnormal. US >>> acute cholecystitis and CBD of 10.2 cm. MRCP concerning for but not definitive for a 3 mm distal CBD stone. She was taken for lap chole with IOC today, cholangiogram positive for choledocholithiasis with associated extrahepatic and mild intrahepatic biliary ductal dilation.   Tina Mccoy gives a history of GERD, no other GI issues. [place]  PMH: HTN, hyperlipidemia, GERD  PSH: tubal ligation  Home medications: Lisinopril, simvastatin,   Current Facility-Administered Medications  Medication Dose Route Frequency Provider Last Rate Last Dose  . acetaminophen (TYLENOL) tablet 650 mg  650 mg Oral Q6H PRN Focht, Jessica L, PA       Or  . acetaminophen (TYLENOL) suppository  650 mg  650 mg Rectal Q6H PRN Focht, Jessica L, PA      . dextrose 5 %-0.9 % sodium chloride infusion   Intravenous Continuous Focht, Jessica L, PA 125 mL/hr at 10/23/18 1111    . lactated ringers infusion   Intravenous Continuous Focht, Jessica L, PA      . meropenem (MERREM) 1 g in sodium chloride 0.9 % 100 mL IVPB  1 g Intravenous Q8H Focht, Jessica L, PA 200 mL/hr at 10/23/18 0632 1 g at 10/23/18 1355  . morphine 2 MG/ML injection 2 mg  2 mg Intravenous Q3H PRN Focht, Jessica L, PA      . ondansetron (ZOFRAN) tablet 4 mg  4 mg Oral Q6H PRN Focht, Jessica L, PA       Or  . ondansetron (ZOFRAN) injection 4 mg  4 mg Intravenous Q6H PRN Focht, Jessica L, PA      . oxyCODONE (Oxy IR/ROXICODONE) immediate release tablet 5 mg  5 mg Oral Q4H PRN Focht, Jessica L, PA      . pantoprazole (PROTONIX) injection 40 mg  40 mg Intravenous Q24H Focht, Jessica L, PA   40 mg at 10/22/18 2247    Allergic to codeine  FHx not contributory  Social History   Social History Narrative   Married   [place] [employer]     Review of Systems: All systems reviewed and negative except where noted in HPI.  Physical Exam: Vital signs in last 24 hours: Temp:  [97.2 F (36.2 C)-98.3 F (36.8 C)] 98.3 F (36.8 C) (10/12 1653) Pulse Rate:  [65-87] 84 (10/12 1653) Resp:  [12-18] 18 (10/12 1653) BP: (105-147)/(56-87) 146/72 (10/12 1653) SpO2:  [94 %-100 %] 94 % (10/12 1653) Last BM Date: 10/22/18 General:   sleepy, well-developed, female in NAD Psych:  Pleasant, cooperative.  Eyes:  Pupils equal, sclera clear, no icterus.   Conjunctiva pink. Ears:  Normal auditory acuity. Nose:  No deformity, discharge,  or lesions. Neck:  Supple; no masses Lungs:  Clear throughout to auscultation.   No wheezes, crackles, or rhonchi.  Heart:  Regular rate and rhythm; no murmurs, no lower extremity edema Abdomen:  Soft, non-distended, surgical dressings in place. BS active, no palp mass    Msk:   Symmetrical without gross deformities. . Neurologic:  Alert and  oriented x4;  grossly normal neurologically. Skin:  Intact without significant lesions or rashes.    Lab Results: Recent Labs    10/22/18 1925 10/23/18 0334  WBC 8.4 6.2  HGB 13.0 12.3  HCT 38.5 36.5  PLT 226 209   BMET Recent Labs    10/22/18 1925 10/23/18 0334  NA 138 138  K 3.8 3.4*  CL 100 104  CO2 26 26  GLUCOSE 103* 99  BUN 10 10  CREATININE 0.96 0.85  CALCIUM 9.1 8.6*   LFT Recent Labs    10/23/18 0334  PROT 6.4*  ALBUMIN 3.5  AST 138*  ALT 293*  ALKPHOS 219*  BILITOT 0.9   . CBC Latest Ref Rng & Units 10/23/2018 10/22/2018  WBC 4.0 - 10.5 K/uL 6.2 8.4  Hemoglobin 12.0 - 15.0 g/dL 12.3 13.0  Hematocrit 36.0 - 46.0 % 36.5 38.5  Platelets 150 - 400 K/uL 209 226    . CMP Latest Ref Rng & Units 10/23/2018 10/22/2018  Glucose 70 - 99 mg/dL 99 103(H)  BUN 6 - 20 mg/dL 10 10  Creatinine 0.44 - 1.00 mg/dL 0.85 0.96  Sodium 135 - 145 mmol/L 138 138  Potassium 3.5 - 5.1 mmol/L 3.4(L) 3.8  Chloride 98 - 111 mmol/L 104 100  CO2 22 - 32 mmol/L 26 26  Calcium 8.9 - 10.3 mg/dL 8.6(L) 9.1  Total Protein 6.5 - 8.1 g/dL 6.4(L) 6.5  Total Bilirubin 0.3 - 1.2 mg/dL 0.9 0.9  Alkaline Phos 38 - 126 U/L 219(H) 207(H)  AST 15 - 41 U/L 138(H) 161(H)  ALT 0 - 44 U/L 293(H) 345(H)   Studies/Results: Dg Cholangiogram Operative  Result Date: 10/23/2018 CLINICAL DATA:  58 year-old undergoing laparoscopic cholecystectomy EXAM: INTRAOPERATIVE CHOLANGIOGRAM TECHNIQUE: Cholangiographic images from the C-arm fluoroscopic device were submitted for interpretation post-operatively. Please see the procedural report for the amount of contrast and the fluoroscopy time utilized. COMPARISON:  MRCP 10/23/2018 FINDINGS: Cine clips are submitted for review. The images demonstrate cannulation of the cystic duct remnant and intraoperative cholangiogram. There is diffuse mild dilation of the common bile and common hepatic  ducts. A small rounded filling defect is present in the common bile duct consistent with choledocholithiasis. The stone is at least partially obstructing. No contrast material is seen entering the duodenum. IMPRESSION: 1. Positive for choledocholithiasis. A single small stone is present in the distal common bile duct. 2. Associated extrahepatic and mild intrahepatic biliary ductal dilatation. Electronically Signed   By: Jacqulynn Cadet  M.D.   On: 10/23/2018 14:59   Mr Abdomen Mrcp Vivien Rossetti Contast  Result Date: 10/23/2018 CLINICAL DATA:  7 mL Gadavist and found to have acute cholecystitis on CT scan. Also common bile duct was dilated with elevated bilirubin and alkaline phosphatase.^53mL GADAVIST GADOBUTROL 1 MMOL/ML IV SOLNCholecystitis / cholangitis, known, follow up EXAM: MRI ABDOMEN WITHOUT AND WITH CONTRAST (INCLUDING MRCP) TECHNIQUE: Multiplanar multisequence MR imaging of the abdomen was performed both before and after the administration of intravenous contrast. Heavily T2-weighted images of the biliary and pancreatic ducts were obtained, and three-dimensional MRCP images were rendered by post processing. CONTRAST:  64mL GADAVIST GADOBUTROL 1 MMOL/ML IV SOLN COMPARISON:  CT 10/21/2018, ultrasound 10/23/2018 FINDINGS: Lower chest:  Lung bases are clear. Hepatobiliary: Multiple gallstones within lumen the gallbladder. Several small stones in the fundus and a large 1.8 cm stone towards the neck region. There is pericholecystic fluid present. The gallbladder is minimally distended 1.5 cm. The common hepatic duct and common bile duct are upper limits of normal at 6 mm. Within the distal common bile duct there is a round the signal abnormality on T2 weighted imaging (image 21/4) which is low signal intensity and measures 3 mm. This is seen on additional T2 weighted sequence (image 21/7). Lesion is not identified on the heavily T2 weighted MRCP sequences or T1 weighted imaging. Pancreas: Normal pancreatic  parenchymal intensity. No ductal dilatation or inflammation. No pancreatic ductal variation. Spleen: Normal spleen. Adrenals/urinary tract: Adrenal glands and kidneys are normal. Stomach/Bowel: Stomach and limited of the small bowel is unremarkable Vascular/Lymphatic: Abdominal aortic normal caliber. No retroperitoneal periportal lymphadenopathy. Musculoskeletal: No aggressive osseous lesion IMPRESSION: 1. Small 3 mm round lesion in the distal common bile duct seen on the axial T2 weighted imaging but not on MRCP sequences. Findings concerning for but not definitive for a small distal common bile duct stone. No intrahepatic or extrahepatic biliary duct dilatation. 2. Multiple gallstones within the lumen of the gallbladder and gallbladder wall thickening with pericholecystic fluid corresponds to findings on comparison CT and ultrasound. Electronically Signed   By: Genevive Bi M.D.   On: 10/23/2018 08:15   US Abdomen Limited Ruq  Result Date: 10/23/2018 CLINICAL DATA:  Abnormal CT abdominal pain with nausea and vomiting EXAM: ULTRASOUND ABDOMEN LIMITED RIGHT UPPER QUADRANT COMPARISON:  CT 10/21/2018 FINDINGS: Gallbladder: Numerous shadowing stones measuring up to 1.3 cm. Increased wall thickness up to 8.7 mm. Negative sonographic Murphy. Edema in the gallbladder wall versus trace pericholecystic fluid. Common bile duct: Diameter: Maximum diameter of 10.2 mm.  No shadowing stones. Liver: No focal lesion identified. Within normal limits in parenchymal echogenicity. Portal vein is patent on color Doppler imaging with normal direction of blood flow towards the liver. Other: None. IMPRESSION: 1. Cholelithiasis with wall thickening and gallbladder wall edema or small amount of pericholecystic fluid but negative sonographic Murphy. Findings still raise concern for an acute cholecystitis; nuclear medicine hepatobiliary imaging could be obtained further evaluation 2. Dilated common bile duct up to 10.2 mm. If ductal  stone is suspected, MRCP could be obtained. Electronically Signed   By: Jasmine Pang M.D.   On: 10/23/2018 00:48      Willette Cluster, NP-C @  10/23/2018, 6:07 PM

## 2018-10-23 NOTE — Op Note (Signed)
Laparoscopic Cholecystectomy with IOC Procedure Note  Indications: This patient presents with symptomatic gallbladder disease and will undergo laparoscopic cholecystectomy.  Liver functions were elevated, but she had a normal bilirubin.  MRCP revealed a 3 mm  Round lesion floating in the distal CBD.  Pre-operative Diagnosis: Calculus of gallbladder with acute cholecystitis, without mention of obstruction  Post-operative Diagnosis: Same; choledocholithiasis  Surgeon: Maia Petties   Assistants: Arrie Aran, RNFA  Anesthesia: General endotracheal anesthesia  ASA Class: 2  Procedure Details  The patient was seen again in the Holding Room. The risks, benefits, complications, treatment options, and expected outcomes were discussed with the patient. The possibilities of reaction to medication, pulmonary aspiration, perforation of viscus, bleeding, recurrent infection, finding a normal gallbladder, the need for additional procedures, failure to diagnose a condition, the possible need to convert to an open procedure, and creating a complication requiring transfusion or operation were discussed with the patient. The likelihood of improving the patient's symptoms with return to their baseline status is good.  The patient and/or family concurred with the proposed plan, giving informed consent. The site of surgery properly noted. The patient was taken to Operating Room, identified as Tina Mccoy and the procedure verified as Laparoscopic Cholecystectomy with Intraoperative Cholangiogram. A Time Out was held and the above information confirmed.  Prior to the induction of general anesthesia, antibiotic prophylaxis was administered. General endotracheal anesthesia was then administered and tolerated well. After the induction, the abdomen was prepped with Chloraprep and draped in the sterile fashion. The patient was positioned in the supine position.  Local anesthetic agent was injected into the skin near  the umbilicus and an incision made. We dissected down to the abdominal fascia with blunt dissection.  The fascia was incised vertically and we entered the peritoneal cavity bluntly.  A pursestring suture of 0-Vicryl was placed around the fascial opening.  The Hasson cannula was inserted and secured with the stay suture.  Pneumoperitoneum was then created with CO2 and tolerated well without any adverse changes in the patient's vital signs. An 11-mm port was placed in the subxiphoid position.  Two 5-mm ports were placed in the right upper quadrant. All skin incisions were infiltrated with a local anesthetic agent before making the incision and placing the trocars.   We positioned the patient in reverse Trendelenburg, tilted slightly to the patient's left.  The gallbladder was identified, the fundus grasped and retracted cephalad. The gallbladder was mildly thickened, but there were omental adhesions to the fundus of the gallbladder.  Adhesions were lysed bluntly and with the electrocautery where indicated, taking care not to injure any adjacent organs or viscus. The infundibulum was grasped and retracted laterally, exposing the peritoneum overlying the triangle of Calot. This was then divided and exposed in a blunt fashion. A critical view of the cystic duct and cystic artery was obtained.  The cystic duct was clearly identified and bluntly dissected circumferentially. The cystic duct was ligated with a clip distally.   An incision was made in the cystic duct and the [facility] cholangiogram catheter introduced. The catheter was secured using a clip. A cholangiogram was then obtained which showed good visualization of the distal and proximal biliary tree with a single filling defect in the distal CBD.  No contrast flowed into the duodenum.  We administered glucagon, waited 3 minutes, then repeated the cholangiogram.  The obstruction persists. The catheter was then removed.   The cystic duct was then ligated with clips  and divided. The cystic  artery was identified, dissected free, ligated with clips and divided as well.   The gallbladder was dissected from the liver bed in retrograde fashion with the electrocautery. We encountered some large veins on the posterior surface of the gallbladder that were controlled with cautery and direct pressure using Surgicel SNOW. The gallbladder was removed and placed in an Ecosac. The liver bed was irrigated and inspected. Hemostasis was achieved with the electrocautery and SNOW. Copious irrigation was utilized and was repeatedly aspirated until clear.  The gallbladder and Eco sac were then removed through the umbilical port site.  The pursestring suture was used to close the umbilical fascia.    We again inspected the right upper quadrant for hemostasis.  Pneumoperitoneum was released as we removed the trocars.  4-0 Monocryl was used to close the skin.   Benzoin, steri-strips, and clean dressings were applied. The patient was then extubated and brought to the recovery room in stable condition. Instrument, sponge, and needle counts were correct at closure and at the conclusion of the case.   Findings: Cholecystitis with Cholelithiasis; choledocholithiasis  Estimated Blood Loss: less than 100 mL         Drains: none         Specimens: Gallbladder           Complications: None; patient tolerated the procedure well.         Disposition: PACU - hemodynamically stable.         Condition: stable  Wilmon Arms. Corliss Skains, MD, [facility]  General/ Trauma Surgery Beeper [phone]  10/23/2018 3:13 PM

## 2018-10-23 NOTE — Progress Notes (Addendum)
PROGRESS NOTE    Tina Mccoy  MHD:[id number] DOB: 1960/06/01 DOA: 10/22/2018 PCP: Clinton Quant, MD    Brief Narrative:  Tina Mccoy is a 58 y.o. female with medical history significant of GERD, hypertension, hyperlipidemia who was sent over from [facility] rocking him after presentation with abdominal pain nausea and vomiting.  Patient was evaluated and found to have acute cholecystitis on CT scan.  Also common bile duct was dilated with elevated bilirubin and alkaline phosphatase.  Lipase is within normal.  No fever and no leukocytosis.  Patient was recommended to have MRCP, surgical consultation and possibly gastroenterology consultation which is not available at [facility] rocking him.  Also ultrasound not readily available.  She was therefore sent here for further evaluation.  MRI did show a 3 mm stone in the distal common bile duct findings of acute cholecystitis including gallstones, gallbladder wall thickening, pericholecystic fluid.  She was taken for lap cholecystectomy where diagnostic cholangiogram did confirm choledocholithiasis.  To undergo ERCP tomorrow.   Assessment & Plan:   Principal Problem:   Choledocholithiasis with acute cholecystitis Active Problems:   GERD (gastroesophageal reflux disease)   Benign essential HTN   Acute cholecystitis   Choledocholithiasis  With Acute Cholecystitis - intermittent episodes of biliary colic for the past year.  No fever or chills, no nausea vomiting or diarrhea.  Patient is hemodynamically stable.   Right upper quadrant ultrasound showed gallstones, gallbladder wall thickening, and dilated common bile duct. MRI abdomen with MRCP showed similar findings and did note 3 mm stone in common bile duct.  Patient taken for lap cholecystectomy.  Diagnostic cholangiogram confirmed choledocholithiasis. - Surgery and GI following - Plan for ERCP tomorrow - Continue IV antibiotics for now - Postop pain control - Antiemetics as needed - Protonix IV  daily -Continue IV hydration   GERD: Chronic. Continue PPI's.  Essential hypertension: Chronic, stable. Continue treatment with home regimen.   DVT prophylaxis: heparin    Code Status: Full Code Family Communication: none at bedside Disposition Plan: Home once cleared by surgery and GI   Consultants:   General surgery  Gastroenterology  Procedures:   Laparoscopic cholecystectomy  Antimicrobials:   Meropenem, start 10/11, stop TBD   Subjective: Patient seen and examined awake laying in bed.  Denies abdominal pain, nausea, vomiting fever, chills.  Episodes pericolic for about the past year.  They are not associated with meals, but random in onset.  Describes abdominal pain as dull aching, located across the upper abdomen.  Objective: Vitals:   10/23/18 1520 10/23/18 1535 10/23/18 1545 10/23/18 1550  BP: (!) 105/56 111/66  117/80  Pulse: 82 78 77 87  Resp: 13 12 12 15   Temp: (!) 97.2 F (36.2 C)   98.1 F (36.7 C)  TempSrc:      SpO2: 97% 100% 100% 97%    Intake/Output Summary (Last 24 hours) at 10/23/2018 1609 Last data filed at 10/23/2018 1458 Gross per 24 hour  Intake 1000 ml  Output 10 ml  Net 990 ml   There were no vitals filed for this visit.  Examination:  General exam: Awake and alert, no acute distress Respiratory system: Clear to auscultation.  Decreased breath sounds.  Respiratory effort normal. Cardiovascular system: S1 & S2 heard, RRR. No JVD, murmurs, rubs, gallops or clicks.  Trace pedal edema. Gastrointestinal system: Abdomen is nondistended, soft and nontender. No organomegaly or masses felt. Normal bowel sounds heard.  Murphy sign negative. Central nervous system: Alert and oriented. No gross focal neurological  deficits. Extremities: Moves all, no edema Psychiatry: Judgement and insight appear normal. Mood & affect appropriate.     Data Reviewed: I have personally reviewed following labs and imaging studies  CBC: Recent Labs  Lab  10/22/18 1925 10/23/18 0334  WBC 8.4 6.2  NEUTROABS 6.3  --   HGB 13.0 12.3  HCT 38.5 36.5  MCV 95.3 94.6  PLT 226 209   Basic Metabolic Panel: Recent Labs  Lab 10/22/18 1925 10/23/18 0334  NA 138 138  K 3.8 3.4*  CL 100 104  CO2 26 26  GLUCOSE 103* 99  BUN 10 10  CREATININE 0.96 0.85  CALCIUM 9.1 8.6*   GFR: CrCl cannot be calculated (Unknown ideal weight.). Liver Function Tests: Recent Labs  Lab 10/22/18 1925 10/23/18 0334  AST 161* 138*  ALT 345* 293*  ALKPHOS 207* 219*  BILITOT 0.9 0.9  PROT 6.5 6.4*  ALBUMIN 3.8 3.5   No results for input(s): LIPASE, AMYLASE in the last 168 hours. No results for input(s): AMMONIA in the last 168 hours. Coagulation Profile: No results for input(s): INR, PROTIME in the last 168 hours. Cardiac Enzymes: No results for input(s): CKTOTAL, CKMB, CKMBINDEX, TROPONINI in the last 168 hours. BNP (last 3 results) No results for input(s): PROBNP in the last 8760 hours. HbA1C: No results for input(s): HGBA1C in the last 72 hours. CBG: No results for input(s): GLUCAP in the last 168 hours. Lipid Profile: No results for input(s): CHOL, HDL, LDLCALC, TRIG, CHOLHDL, LDLDIRECT in the last 72 hours. Thyroid Function Tests: No results for input(s): TSH, T4TOTAL, FREET4, T3FREE, THYROIDAB in the last 72 hours. Anemia Panel: No results for input(s): VITAMINB12, FOLATE, FERRITIN, TIBC, IRON, RETICCTPCT in the last 72 hours. Sepsis Labs: No results for input(s): PROCALCITON, LATICACIDVEN in the last 168 hours.  Recent Results (from the past 240 hour(s))  SARS CORONAVIRUS 2 (TAT 6-24 HRS) Nasopharyngeal Nasopharyngeal Swab     Status: None   Collection Time: 10/23/18  3:56 AM   Specimen: Nasopharyngeal Swab  Result Value Ref Range Status   SARS Coronavirus 2 NEGATIVE NEGATIVE Final    Comment: (NOTE) SARS-CoV-2 target nucleic acids are NOT DETECTED. The SARS-CoV-2 RNA is generally detectable in upper and lower respiratory specimens  during the acute phase of infection. Negative results do not preclude SARS-CoV-2 infection, do not rule out co-infections with other pathogens, and should not be used as the sole basis for treatment or other patient management decisions. Negative results must be combined with clinical observations, patient history, and epidemiological information. The expected result is Negative. Fact Sheet for Patients: HairSlick.no Fact Sheet for Healthcare Providers: quierodirigir.com This test is not yet approved or cleared by the [place] FDA and  has been authorized for detection and/or diagnosis of SARS-CoV-2 by FDA under an Emergency Use Authorization (EUA). This EUA will remain  in effect (meaning this test can be used) for the duration of the COVID-19 declaration under Section 56 4(b)(1) of the Act, 21 U.S.C. section 360bbb-3(b)(1), unless the authorization is terminated or revoked sooner. Performed at [facility], [address], [place], [place] [address]          Radiology Studies: Dg Cholangiogram Operative  Result Date: 10/23/2018 CLINICAL DATA:  58 year-old undergoing laparoscopic cholecystectomy EXAM: INTRAOPERATIVE CHOLANGIOGRAM TECHNIQUE: Cholangiographic images from the C-arm fluoroscopic device were submitted for interpretation post-operatively. Please see the procedural report for the amount of contrast and the fluoroscopy time utilized. COMPARISON:  MRCP 10/23/2018 FINDINGS: Cine clips are  submitted for review. The images demonstrate cannulation of the cystic duct remnant and intraoperative cholangiogram. There is diffuse mild dilation of the common bile and common hepatic ducts. A small rounded filling defect is present in the common bile duct consistent with choledocholithiasis. The stone is at least partially obstructing. No contrast material is seen entering the duodenum. IMPRESSION: 1. Positive for  choledocholithiasis. A single small stone is present in the distal common bile duct. 2. Associated extrahepatic and mild intrahepatic biliary ductal dilatation. Electronically Signed   By: Malachy Moan M.D.   On: 10/23/2018 14:59   Mr Abdomen Mrcp Vivien Rossetti Contast  Result Date: 10/23/2018 CLINICAL DATA:  7 mL Gadavist and found to have acute cholecystitis on CT scan. Also common bile duct was dilated with elevated bilirubin and alkaline phosphatase.^70mL GADAVIST GADOBUTROL 1 MMOL/ML IV SOLNCholecystitis / cholangitis, known, follow up EXAM: MRI ABDOMEN WITHOUT AND WITH CONTRAST (INCLUDING MRCP) TECHNIQUE: Multiplanar multisequence MR imaging of the abdomen was performed both before and after the administration of intravenous contrast. Heavily T2-weighted images of the biliary and pancreatic ducts were obtained, and three-dimensional MRCP images were rendered by post processing. CONTRAST:  75mL GADAVIST GADOBUTROL 1 MMOL/ML IV SOLN COMPARISON:  CT 10/21/2018, ultrasound 10/23/2018 FINDINGS: Lower chest:  Lung bases are clear. Hepatobiliary: Multiple gallstones within lumen the gallbladder. Several small stones in the fundus and a large 1.8 cm stone towards the neck region. There is pericholecystic fluid present. The gallbladder is minimally distended 1.5 cm. The common hepatic duct and common bile duct are upper limits of normal at 6 mm. Within the distal common bile duct there is a round the signal abnormality on T2 weighted imaging (image 21/4) which is low signal intensity and measures 3 mm. This is seen on additional T2 weighted sequence (image 21/7). Lesion is not identified on the heavily T2 weighted MRCP sequences or T1 weighted imaging. Pancreas: Normal pancreatic parenchymal intensity. No ductal dilatation or inflammation. No pancreatic ductal variation. Spleen: Normal spleen. Adrenals/urinary tract: Adrenal glands and kidneys are normal. Stomach/Bowel: Stomach and limited of the small bowel is  unremarkable Vascular/Lymphatic: Abdominal aortic normal caliber. No retroperitoneal periportal lymphadenopathy. Musculoskeletal: No aggressive osseous lesion IMPRESSION: 1. Small 3 mm round lesion in the distal common bile duct seen on the axial T2 weighted imaging but not on MRCP sequences. Findings concerning for but not definitive for a small distal common bile duct stone. No intrahepatic or extrahepatic biliary duct dilatation. 2. Multiple gallstones within the lumen of the gallbladder and gallbladder wall thickening with pericholecystic fluid corresponds to findings on comparison CT and ultrasound. Electronically Signed   By: Genevive Bi M.D.   On: 10/23/2018 08:15   US Abdomen Limited Ruq  Result Date: 10/23/2018 CLINICAL DATA:  Abnormal CT abdominal pain with nausea and vomiting EXAM: ULTRASOUND ABDOMEN LIMITED RIGHT UPPER QUADRANT COMPARISON:  CT 10/21/2018 FINDINGS: Gallbladder: Numerous shadowing stones measuring up to 1.3 cm. Increased wall thickness up to 8.7 mm. Negative sonographic Murphy. Edema in the gallbladder wall versus trace pericholecystic fluid. Common bile duct: Diameter: Maximum diameter of 10.2 mm.  No shadowing stones. Liver: No focal lesion identified. Within normal limits in parenchymal echogenicity. Portal vein is patent on color Doppler imaging with normal direction of blood flow towards the liver. Other: None. IMPRESSION: 1. Cholelithiasis with wall thickening and gallbladder wall edema or small amount of pericholecystic fluid but negative sonographic Murphy. Findings still raise concern for an acute cholecystitis; nuclear medicine hepatobiliary imaging could be obtained further evaluation 2.  Dilated common bile duct up to 10.2 mm. If ductal stone is suspected, MRCP could be obtained. Electronically Signed   By: Jasmine PangKim  Fujinaga M.D.   On: 10/23/2018 00:48        Scheduled Meds:  [MAR Hold] heparin  5,000 Units Subcutaneous Q8H   [MAR Hold] pantoprazole (PROTONIX)  IV  40 mg Intravenous Q24H   Continuous Infusions:  dextrose 5 % and 0.9% NaCl 125 mL/hr at 10/23/18 1111   lactated ringers     [MAR Hold] meropenem (MERREM) IV 1 g (10/23/18 16100632)     LOS: 1 day    Time spent: 35-40 min    Pennie BanterKelly A Cornell Gaber, DO [facility] Pager [phone]  If 7PM-7AM, please contact night-coverage [email or web address] Password [email or web address] 10/23/2018, 4:09 PM

## 2018-10-24 ENCOUNTER — Inpatient Hospital Stay (HOSPITAL_COMMUNITY): Payer: BC Managed Care – PPO | Admitting: Certified Registered"

## 2018-10-24 ENCOUNTER — Inpatient Hospital Stay (HOSPITAL_COMMUNITY): Payer: BC Managed Care – PPO

## 2018-10-24 ENCOUNTER — Encounter (HOSPITAL_COMMUNITY)
Admission: RE | Disposition: A | Discharge status: Home / Self Care | Payer: Self-pay | Source: Other Acute Inpatient Hospital | Attending: Internal Medicine

## 2018-10-24 ENCOUNTER — Encounter (HOSPITAL_COMMUNITY): Payer: Self-pay | Admitting: Surgery

## 2018-10-24 DIAGNOSIS — I10 Essential (primary) hypertension: Secondary | ICD-10-CM

## 2018-10-24 DIAGNOSIS — K219 Gastro-esophageal reflux disease without esophagitis: Secondary | ICD-10-CM

## 2018-10-24 DIAGNOSIS — K5900 Constipation, unspecified: Secondary | ICD-10-CM | POA: Diagnosis present

## 2018-10-24 HISTORY — PX: REMOVAL OF STONES: SHX5545

## 2018-10-24 HISTORY — PX: SPHINCTEROTOMY: SHX5544

## 2018-10-24 HISTORY — PX: ERCP: SHX5425

## 2018-10-24 LAB — CBC WITH DIFFERENTIAL/PLATELET
Abs Immature Granulocytes: 0.05 10*3/uL (ref 0.00–0.07)
Basophils Absolute: 0 10*3/uL (ref 0.0–0.1)
Basophils Relative: 0 %
Eosinophils Absolute: 0 10*3/uL (ref 0.0–0.5)
Eosinophils Relative: 0 %
HCT: 34.2 % — ABNORMAL LOW (ref 36.0–46.0)
Hemoglobin: 11.5 g/dL — ABNORMAL LOW (ref 12.0–15.0)
Immature Granulocytes: 0 %
Lymphocytes Relative: 7 %
Lymphs Abs: 0.8 10*3/uL (ref 0.7–4.0)
MCH: 32.3 pg (ref 26.0–34.0)
MCHC: 33.6 g/dL (ref 30.0–36.0)
MCV: 96.1 fL (ref 80.0–100.0)
Monocytes Absolute: 1 10*3/uL (ref 0.1–1.0)
Monocytes Relative: 8 %
Neutro Abs: 9.5 10*3/uL — ABNORMAL HIGH (ref 1.7–7.7)
Neutrophils Relative %: 85 %
Platelets: 194 10*3/uL (ref 150–400)
RBC: 3.56 MIL/uL — ABNORMAL LOW (ref 3.87–5.11)
RDW: 12.8 % (ref 11.5–15.5)
WBC: 11.4 10*3/uL — ABNORMAL HIGH (ref 4.0–10.5)
nRBC: 0 % (ref 0.0–0.2)

## 2018-10-24 LAB — COMPREHENSIVE METABOLIC PANEL
ALT: 252 U/L — ABNORMAL HIGH (ref 0–44)
AST: 146 U/L — ABNORMAL HIGH (ref 15–41)
Albumin: 3.2 g/dL — ABNORMAL LOW (ref 3.5–5.0)
Alkaline Phosphatase: 179 U/L — ABNORMAL HIGH (ref 38–126)
Anion gap: 9 (ref 5–15)
BUN: 5 mg/dL — ABNORMAL LOW (ref 6–20)
CO2: 25 mmol/L (ref 22–32)
Calcium: 8.5 mg/dL — ABNORMAL LOW (ref 8.9–10.3)
Chloride: 104 mmol/L (ref 98–111)
Creatinine, Ser: 0.82 mg/dL (ref 0.44–1.00)
GFR calc Af Amer: >60 mL/min (ref >60)
GFR calc non Af Amer: >60 mL/min (ref >60)
Glucose, Bld: 128 mg/dL — ABNORMAL HIGH (ref 70–99)
Potassium: 3.9 mmol/L (ref 3.5–5.1)
Sodium: 138 mmol/L (ref 135–145)
Total Bilirubin: 0.6 mg/dL (ref 0.3–1.2)
Total Protein: 6 g/dL — ABNORMAL LOW (ref 6.5–8.1)

## 2018-10-24 LAB — SURGICAL PATHOLOGY

## 2018-10-24 SURGERY — ERCP, WITH INTERVENTION IF INDICATED
Anesthesia: General

## 2018-10-24 MED ORDER — INDOMETHACIN 50 MG RE SUPP
RECTAL | Status: AC
  Filled 2018-10-24: qty 2

## 2018-10-24 MED ORDER — FENTANYL CITRATE (PF) 100 MCG/2ML IJ SOLN
INTRAMUSCULAR | Status: DC | PRN
  Administered 2018-10-24: 100 ug via INTRAVENOUS

## 2018-10-24 MED ORDER — TRAMADOL HCL 50 MG PO TABS
50.0000 mg | ORAL_TABLET | Freq: Four times a day (QID) | ORAL | Status: DC
  Filled 2018-10-24 (×3): qty 1

## 2018-10-24 MED ORDER — PHENYLEPHRINE 40 MCG/ML (10ML) SYRINGE FOR IV PUSH (FOR BLOOD PRESSURE SUPPORT)
PREFILLED_SYRINGE | INTRAVENOUS | Status: DC | PRN
  Administered 2018-10-24: 80 ug via INTRAVENOUS

## 2018-10-24 MED ORDER — PROPOFOL 500 MG/50ML IV EMUL
INTRAVENOUS | Status: DC | PRN
  Administered 2018-10-24: 25 ug/kg/min via INTRAVENOUS

## 2018-10-24 MED ORDER — ONDANSETRON HCL 4 MG/2ML IJ SOLN
INTRAMUSCULAR | Status: DC | PRN
  Administered 2018-10-24: 4 mg via INTRAVENOUS

## 2018-10-24 MED ORDER — DEXAMETHASONE SODIUM PHOSPHATE 10 MG/ML IJ SOLN
INTRAMUSCULAR | Status: DC | PRN
  Administered 2018-10-24: 5 mg via INTRAVENOUS

## 2018-10-24 MED ORDER — LACTATED RINGERS IV SOLN
INTRAVENOUS | Status: DC | PRN
  Administered 2018-10-24: 14:00:00 via INTRAVENOUS

## 2018-10-24 MED ORDER — DOCUSATE SODIUM 100 MG PO CAPS: 100.0000 mg | ORAL_CAPSULE | Freq: Two times a day (BID) | ORAL | Status: DC | PRN

## 2018-10-24 MED ORDER — GLUCAGON HCL RDNA (DIAGNOSTIC) 1 MG IJ SOLR
INTRAMUSCULAR | Status: AC
  Filled 2018-10-24: qty 1

## 2018-10-24 MED ORDER — INDOMETHACIN 50 MG RE SUPP
RECTAL | Status: DC | PRN
  Administered 2018-10-24: 100 mg via RECTAL

## 2018-10-24 MED ORDER — MORPHINE SULFATE (PF) 2 MG/ML IV SOLN: 2.0000 mg | INTRAVENOUS | Status: DC | PRN

## 2018-10-24 MED ORDER — PROPOFOL 10 MG/ML IV BOLUS
INTRAVENOUS | Status: DC | PRN
  Administered 2018-10-24: 130 mg via INTRAVENOUS

## 2018-10-24 MED ORDER — SODIUM CHLORIDE 0.9 % IV SOLN: INTRAVENOUS | Status: DC

## 2018-10-24 MED ORDER — LIDOCAINE 2% (20 MG/ML) 5 ML SYRINGE
INTRAMUSCULAR | Status: DC | PRN
  Administered 2018-10-24: 100 mg via INTRAVENOUS

## 2018-10-24 MED ORDER — ACETAMINOPHEN 325 MG PO TABS
650.0000 mg | ORAL_TABLET | Freq: Four times a day (QID) | ORAL | Status: DC
  Administered 2018-10-24 – 2018-10-25 (×4): 650 mg via ORAL
  Filled 2018-10-24 (×4): qty 2

## 2018-10-24 MED ORDER — MIDAZOLAM HCL 5 MG/5ML IJ SOLN
INTRAMUSCULAR | Status: DC | PRN
  Administered 2018-10-24: 2 mg via INTRAVENOUS

## 2018-10-24 MED ORDER — BISACODYL 5 MG PO TBEC: 5.0000 mg | DELAYED_RELEASE_TABLET | Freq: Every day | ORAL | Status: DC | PRN

## 2018-10-24 MED ORDER — SUCCINYLCHOLINE CHLORIDE 200 MG/10ML IV SOSY
PREFILLED_SYRINGE | INTRAVENOUS | Status: DC | PRN
  Administered 2018-10-24: 100 mg via INTRAVENOUS

## 2018-10-24 MED ORDER — SODIUM CHLORIDE 0.9 % IV SOLN
INTRAVENOUS | Status: DC | PRN
  Administered 2018-10-24: 25 mL

## 2018-10-24 MED ORDER — CIPROFLOXACIN IN D5W 400 MG/200ML IV SOLN
INTRAVENOUS | Status: AC
  Filled 2018-10-24: qty 200

## 2018-10-24 NOTE — Op Note (Signed)
[facility] Patient Name: Tina Mccoy Procedure Date : 10/24/2018 MRN: [id number] Attending MD: Iva Boop , MD Date of Birth: [birth year] CSN: [id number] Age: 58 Admit Type: Inpatient Procedure:                ERCP Indications:              Bile duct stone(s) seen on IOC and MRCP Providers:                Iva Boop, MD, Margaree Mackintosh, RN,                            Lanna Poche, Technician, Gareth Eagle, CRNA Referring MD:              Medicines:                General Anesthesia, Merrem 500 mg IV Complications:            No immediate complications. Estimated Blood Loss:     Estimated blood loss was minimal. Procedure:                Pre-Anesthesia Assessment:                           - Prior to the procedure, a History and Physical                            was performed, and patient medications and                            allergies were reviewed. The patient's tolerance of                            previous anesthesia was also reviewed. The risks                            and benefits of the procedure and the sedation                            options and risks were discussed with the patient.                            All questions were answered, and informed consent                            was obtained. Prior Anticoagulants: The patient has                            taken no previous anticoagulant or antiplatelet                            agents. ASA Grade Assessment: II - A patient with  mild systemic disease. After reviewing the risks                            and benefits, the patient was deemed in                            satisfactory condition to undergo the procedure.                           After obtaining informed consent, the scope was                            passed under direct vision. Throughout the                            procedure, the  patient's blood pressure, pulse, and                            oxygen saturations were monitored continuously. The                            [id number]-[id number] ([id number] Olympus duodenoscope was                            introduced through the mouth, and used to inject                            contrast into and used to inject contrast into the                            bile duct. The ERCP was accomplished without                            difficulty. The patient tolerated the procedure                            well. Scope In: Scope Out: Findings:      A scout film of the abdomen was obtained. Surgical clips, consistent       with a previous cholecystectomy, were seen in the area of the right       upper quadrant of the abdomen. The esophagus was successfully intubated       under direct vision. The scope was advanced to a normal major papilla in       the descending duodenum without detailed examination of the pharynx,       larynx and associated structures, and upper GI tract. The upper GI tract       was grossly normal. The bile duct was cannulated with a wire and then       filled with contrast. Tiny filling defect seen mid duct. Sphincterotomy       performed and a large amount of contrast and bilious fluid out. Then       balloon sweeps and occlusion cholangiograms were negative for stones so       I think the stone came out with the sphincterotomy alone.  Mildly dilated CBD 11-12 mm and normal intrahepatics and s/p chole      No pancreatogram by intent. Impression:               - Choledocholithiasis was found. Complete removal                            was accomplished by biliary sphincterotomy and                            balloon extraction/sweeps were negative . Recommendation:           - Return patient to hospital ward for ongoing care.                           - Check labs and advance diet and home in [place] if ok                           I stopped Meropenem Procedure  Code(s):        --- Professional ---                           [phone], Endoscopic retrograde                            cholangiopancreatography (ERCP); with removal of                            calculi/debris from biliary/pancreatic duct(s)                           43262, Endoscopic retrograde                            cholangiopancreatography (ERCP); with                            sphincterotomy/papillotomy Diagnosis Code(s):        --- Professional ---                           K80.50, Calculus of bile duct without cholangitis                            or cholecystitis without obstruction CPT copyright 2019 American Medical Association. All rights reserved. The codes documented in this report are preliminary and upon coder review may  be revised to meet current compliance requirements. Gatha Mayer, MD 10/24/2018 3:10:52 PM This report has been signed electronically. Number of Addenda: 0

## 2018-10-24 NOTE — Progress Notes (Signed)
   Patient Name: Tina Mccoy Date of Encounter: 10/24/2018, 10:44 AM    Subjective  Some post-op pain    Objective  BP 126/63 (BP Location: Right Arm)   Pulse 70   Temp 98.4 F (36.9 C) (Oral)   Resp 18   LMP  (Within Years) Comment: stated her last menstrual cycle was 4 years ago  SpO2 93%  NAD Lungs cta NL cor abd - post-op tenderness  Lab Results  Component Value Date   ALT 252 (H) 10/24/2018   AST 146 (H) 10/24/2018   ALKPHOS 179 (H) 10/24/2018   BILITOT 0.6 10/24/2018       Assessment and Plan  Choledocholithiasis  ERCP today  The risks and benefits as well as alternatives of endoscopic procedure(s) have been discussed and reviewed. All questions answered. The patient agrees to proceed.   Gatha Mayer, MD, [facility] 10/24/2018 10:44 AM Pager [phone]

## 2018-10-24 NOTE — Progress Notes (Addendum)
PROGRESS NOTE    Tina Mccoy  ZOX:[id number] DOB: [birth year] DOA: 10/22/2018 PCP: Eldridge AbrahamsPomposini, Daniel L, MD    Brief Narrative:  Tina Pontvonda Thorntonis a 58 y.o.femalewith medical history significant ofGERD, hypertension, hyperlipidemia who [facility] presentation with abdominal pain nausea and vomiting. Patient was evaluated and found to have acute cholecystitis on CT scan. Also common bile duct was dilated with elevated bilirubin and alkaline phosphatase. Lipase is within normal. No fever and no leukocytosis. Patient was recommended to have MRCP, surgical consultation and possibly gastroenterology consultation which [facility]. Also ultrasound not readily available. She was therefore sent here for further evaluation.  MRI did show a 3 mm stone in the distal common bile duct findings of acute cholecystitis including gallstones, gallbladder wall thickening, pericholecystic fluid.  She was taken for lap cholecystectomy on 10/12 where diagnostic cholangiogram did confirm choledocholithiasis.  Underwent ERCP today (10/13) which was successful.  Antibiotics stopped by GI after the procedure.  She is to be monitored overnight, diet advanced, and likely discharged in the AM.    Assessment & Plan:   Principal Problem:   Choledocholithiasis with acute cholecystitis Active Problems:   GERD (gastroesophageal reflux disease)   Benign essential HTN   Acute cholecystitis   Constipation   Choledocholithiasis  With Acute Cholecystitis - intermittent episodes of biliary colic for the past year.  No fever or chills, no nausea vomiting or diarrhea. Patient is hemodynamically stable.  Right upper quadrant ultrasound showed gallstones, gallbladder wall thickening, and dilated common bile duct. MRI abdomen with MRCP showed similar findings and did note 3 mm stone in common bile duct.  Patient taken for lap cholecystectomy.  Diagnostic  cholangiogram confirmed choledocholithiasis. - Surgery and GI following - Plan for ERCP tomorrow - Continue IV antibiotics for now - Postop pain control - Antiemetics as needed - Protonix IV daily -Continue IV hydration  Constipation: Chronic.  Exacerbated by pain meds. - Colace, Bisacodyl PRN   GERD: Chronic. Continue PPI's.  Essential hypertension:Chronic, stable. Continue treatment with home regimen.   DVT prophylaxis: SCD's    Code Status: Full Code Family Communication: none at bedside Disposition Plan: Home once cleared by surgery and GI   Consultants:   General surgery  Gastroenterology  Procedures:   Laparoscopic cholecystectomy 10/12  ERCP 10/13  Antimicrobials:   Meropenem, 10/11 to 10/13   Subjective: Patient awake sitting up in bed this morning.  Reports 6 of 10 pain.  Did have vomiting after pain medication again.  Slept intermittently.  No fevers or chills.   Objective: Vitals:   10/24/18 1525 10/24/18 1535 10/24/18 1545 10/24/18 1702  BP: (!) 149/72 (!) 157/73 (!) 157/76 (!) 142/77  Pulse: 70 65 62 60  Resp: 17 16 16 18   Temp:    98.6 F (37 C)  TempSrc:    Oral  SpO2: 100% 98% 98% 94%  Weight:      Height:        Intake/Output Summary (Last 24 hours) at 10/24/2018 1844 Last data filed at 10/24/2018 1800 Gross per 24 hour  Intake 2376.93 ml  Output 605 ml  Net 1771.93 ml   Filed Weights   10/24/18 1246  Weight: 70.8 kg    Examination:  General exam: awake, alert, no acute distress Respiratory system: Decreased breath sounds, but clear without wheezing/rales/rhonchi.  Respiratory effort normal. Cardiovascular system: S1 & S2 heard, RRR. Trace pedal edema. No murmur/rub/gallop noted. Gastrointestinal system: Diffuse tenderness greated  at RUQ. Hypoactive bowel sounds heard.  Incisions dressed and without surrounding warm or erythema. Central nervous system: Alert and oriented. No gross focal neurological deficits. Normal  speech. Extremities: Moves all, no edema Psychiatry: Judgement and insight appear normal. Mood & affect appropriate.    Data Reviewed: I have personally reviewed following labs and imaging studies  CBC: Recent Labs  Lab 10/22/18 1925 10/23/18 0334 10/24/18 0343  WBC 8.4 6.2 11.4*  NEUTROABS 6.3  --  9.5*  HGB 13.0 12.3 11.5*  HCT 38.5 36.5 34.2*  MCV 95.3 94.6 96.1  PLT 226 209 194   Basic Metabolic Panel: Recent Labs  Lab 10/22/18 1925 10/23/18 0334 10/24/18 0343  NA 138 138 138  K 3.8 3.4* 3.9  CL 100 104 104  CO2 26 26 25   GLUCOSE 103* 99 128*  BUN 10 10 5*  CREATININE 0.96 0.85 0.82  CALCIUM 9.1 8.6* 8.5*   GFR: Estimated Creatinine Clearance: 67.3 mL/min (by C-G formula based on SCr of 0.82 mg/dL). Liver Function Tests: Recent Labs  Lab 10/22/18 1925 10/23/18 0334 10/24/18 0343  AST 161* 138* 146*  ALT 345* 293* 252*  ALKPHOS 207* 219* 179*  BILITOT 0.9 0.9 0.6  PROT 6.5 6.4* 6.0*  ALBUMIN 3.8 3.5 3.2*   No results for input(s): LIPASE, AMYLASE in the last 168 hours. No results for input(s): AMMONIA in the last 168 hours. Coagulation Profile: No results for input(s): INR, PROTIME in the last 168 hours. Cardiac Enzymes: No results for input(s): CKTOTAL, CKMB, CKMBINDEX, TROPONINI in the last 168 hours. BNP (last 3 results) No results for input(s): PROBNP in the last 8760 hours. HbA1C: No results for input(s): HGBA1C in the last 72 hours. CBG: No results for input(s): GLUCAP in the last 168 hours. Lipid Profile: No results for input(s): CHOL, HDL, LDLCALC, TRIG, CHOLHDL, LDLDIRECT in the last 72 hours. Thyroid Function Tests: No results for input(s): TSH, T4TOTAL, FREET4, T3FREE, THYROIDAB in the last 72 hours. Anemia Panel: No results for input(s): VITAMINB12, FOLATE, FERRITIN, TIBC, IRON, RETICCTPCT in the last 72 hours. Sepsis Labs: No results for input(s): PROCALCITON, LATICACIDVEN in the last 168 hours.  Recent Results (from the past 240  hour(s))  SARS CORONAVIRUS 2 (TAT 6-24 HRS) Nasopharyngeal Nasopharyngeal Swab     Status: None   Collection Time: 10/23/18  3:56 AM   Specimen: Nasopharyngeal Swab  Result Value Ref Range Status   SARS Coronavirus 2 NEGATIVE NEGATIVE Final    Comment: (NOTE) SARS-CoV-2 target nucleic acids are NOT DETECTED. The SARS-CoV-2 RNA is generally detectable in upper and lower respiratory specimens during the acute phase of infection. Negative results do not preclude SARS-CoV-2 infection, do not rule out co-infections with other pathogens, and should not be used as the sole basis for treatment or other patient management decisions. Negative results must be combined with clinical observations, patient history, and epidemiological information. The expected result is Negative. Fact Sheet for Patients: 12/23/18 Fact Sheet for Healthcare Providers: HairSlick.no This test is not yet approved or cleared by the quierodirigir.com FDA and  has been authorized for detection and/or diagnosis of SARS-CoV-2 by FDA under an Emergency Use Authorization (EUA). This EUA will remain  in effect (meaning this test can be used) for the duration of the COVID-19 declaration under Section 56 4(b)(1) of the Act, 21 U.S.C. section 360bbb-3(b)(1), unless the authorization is terminated or revoked sooner. Performed at [facility], [address] [place], [place]  Radiology Studies: Dg Cholangiogram Operative  Result Date: 10/23/2018 CLINICAL DATA:  58 year-old undergoing laparoscopic cholecystectomy EXAM: INTRAOPERATIVE CHOLANGIOGRAM TECHNIQUE: Cholangiographic images from the C-arm fluoroscopic device were submitted for interpretation post-operatively. Please see the procedural report for the amount of contrast and the fluoroscopy time utilized. COMPARISON:  MRCP 10/23/2018 FINDINGS: Cine clips are submitted for review. The  images demonstrate cannulation of the cystic duct remnant and intraoperative cholangiogram. There is diffuse mild dilation of the common bile and common hepatic ducts. A small rounded filling defect is present in the common bile duct consistent with choledocholithiasis. The stone is at least partially obstructing. No contrast material is seen entering the duodenum. IMPRESSION: 1. Positive for choledocholithiasis. A single small stone is present in the distal common bile duct. 2. Associated extrahepatic and mild intrahepatic biliary ductal dilatation. Electronically Signed   By: Malachy Moan M.D.   On: 10/23/2018 14:59   Dg Ercp Biliary & Pancreatic Ducts  Result Date: 10/24/2018 CLINICAL DATA:  Common bile duct stone removal EXAM: ERCP TECHNIQUE: Multiple spot images obtained with the fluoroscopic device and submitted for interpretation post-procedure. COMPARISON:  Intra op cholangiogram from previous day FINDINGS: A series of fluoroscopic spot images document endoscopic cannulation and opacification of the CBD with passage of a balloon catheter into the CBD. Incomplete opacification of the intrahepatic biliary tree, which appears decompressed centrally. Cholecystectomy clips. No leak or extravasation identified. IMPRESSION: Endoscopic CBD cannulation and intervention. These images were submitted for radiologic interpretation only. Please see the procedural report for the amount of contrast and the fluoroscopy time utilized. Electronically Signed   By: Corlis Leak M.D.   On: 10/24/2018 15:32   Mr Abdomen Mrcp Vivien Rossetti Contast  Result Date: 10/23/2018 CLINICAL DATA:  7 mL Gadavist and found to have acute cholecystitis on CT scan. Also common bile duct was dilated with elevated bilirubin and alkaline phosphatase.^32mL GADAVIST GADOBUTROL 1 MMOL/ML IV SOLNCholecystitis / cholangitis, known, follow up EXAM: MRI ABDOMEN WITHOUT AND WITH CONTRAST (INCLUDING MRCP) TECHNIQUE: Multiplanar multisequence MR imaging of  the abdomen was performed both before and after the administration of intravenous contrast. Heavily T2-weighted images of the biliary and pancreatic ducts were obtained, and three-dimensional MRCP images were rendered by post processing. CONTRAST:  7mL GADAVIST GADOBUTROL 1 MMOL/ML IV SOLN COMPARISON:  CT 10/21/2018, ultrasound 10/23/2018 FINDINGS: Lower chest:  Lung bases are clear. Hepatobiliary: Multiple gallstones within lumen the gallbladder. Several small stones in the fundus and a large 1.8 cm stone towards the neck region. There is pericholecystic fluid present. The gallbladder is minimally distended 1.5 cm. The common hepatic duct and common bile duct are upper limits of normal at 6 mm. Within the distal common bile duct there is a round the signal abnormality on T2 weighted imaging (image 21/4) which is low signal intensity and measures 3 mm. This is seen on additional T2 weighted sequence (image 21/7). Lesion is not identified on the heavily T2 weighted MRCP sequences or T1 weighted imaging. Pancreas: Normal pancreatic parenchymal intensity. No ductal dilatation or inflammation. No pancreatic ductal variation. Spleen: Normal spleen. Adrenals/urinary tract: Adrenal glands and kidneys are normal. Stomach/Bowel: Stomach and limited of the small bowel is unremarkable Vascular/Lymphatic: Abdominal aortic normal caliber. No retroperitoneal periportal lymphadenopathy. Musculoskeletal: No aggressive osseous lesion IMPRESSION: 1. Small 3 mm round lesion in the distal common bile duct seen on the axial T2 weighted imaging but not on MRCP sequences. Findings concerning for but not definitive for a small distal common bile duct stone. No intrahepatic  or extrahepatic biliary duct dilatation. 2. Multiple gallstones within the lumen of the gallbladder and gallbladder wall thickening with pericholecystic fluid corresponds to findings on comparison CT and ultrasound. Electronically Signed   By: Suzy Bouchard M.D.   On:  10/23/2018 08:15   US Abdomen Limited Ruq  Result Date: 10/23/2018 CLINICAL DATA:  Abnormal CT abdominal pain with nausea and vomiting EXAM: ULTRASOUND ABDOMEN LIMITED RIGHT UPPER QUADRANT COMPARISON:  CT 10/21/2018 FINDINGS: Gallbladder: Numerous shadowing stones measuring up to 1.3 cm. Increased wall thickness up to 8.7 mm. Negative sonographic Murphy. Edema in the gallbladder wall versus trace pericholecystic fluid. Common bile duct: Diameter: Maximum diameter of 10.2 mm.  No shadowing stones. Liver: No focal lesion identified. Within normal limits in parenchymal echogenicity. Portal vein is patent on color Doppler imaging with normal direction of blood flow towards the liver. Other: None. IMPRESSION: 1. Cholelithiasis with wall thickening and gallbladder wall edema or small amount of pericholecystic fluid but negative sonographic Murphy. Findings still raise concern for an acute cholecystitis; nuclear medicine hepatobiliary imaging could be obtained further evaluation 2. Dilated common bile duct up to 10.2 mm. If ductal stone is suspected, MRCP could be obtained. Electronically Signed   By: Donavan Foil M.D.   On: 10/23/2018 00:48        Scheduled Meds:  acetaminophen  650 mg Oral Q6H   pantoprazole (PROTONIX) IV  40 mg Intravenous Q24H   traMADol  50 mg Oral Q6H   Continuous Infusions:  dextrose 5 % and 0.9% NaCl 125 mL/hr at 10/24/18 0809     LOS: 2 days    Time spent: 35-40 min    Ezekiel Slocumb, DO [facility] Pager [phone]  If 7PM-7AM, please contact night-coverage [email or web address] Password [facility] 10/24/2018, 6:44 PM

## 2018-10-24 NOTE — Anesthesia Procedure Notes (Signed)
Procedure Name: Intubation Date/Time: 10/24/2018 2:28 PM Performed by: Imagene Riches, CRNA Pre-anesthesia Checklist: Patient identified, Emergency Drugs available, Suction available and Patient being monitored Patient Re-evaluated:Patient Re-evaluated prior to induction Oxygen Delivery Method: Circle System Utilized Preoxygenation: Pre-oxygenation with 100% oxygen Induction Type: IV induction Ventilation: Mask ventilation without difficulty Laryngoscope Size: Miller and 2 Grade View: Grade I Tube type: Oral Tube size: 7.0 mm Number of attempts: 1 Airway Equipment and Method: Stylet and Oral airway Placement Confirmation: ETT inserted through vocal cords under direct vision,  positive ETCO2 and breath sounds checked- equal and bilateral Secured at: 22 cm Tube secured with: Tape Dental Injury: Teeth and Oropharynx as per pre-operative assessment

## 2018-10-24 NOTE — Anesthesia Postprocedure Evaluation (Signed)
Anesthesia Post Note  Patient: Tina Mccoy  Procedure(s) Performed: ENDOSCOPIC RETROGRADE CHOLANGIOPANCREATOGRAPHY (ERCP) (N/A ) SPHINCTEROTOMY REMOVAL OF STONES     Patient location during evaluation: PACU Anesthesia Type: General Level of consciousness: awake and alert Pain management: pain level controlled Vital Signs Assessment: post-procedure vital signs reviewed and stable Respiratory status: spontaneous breathing, nonlabored ventilation, respiratory function stable and patient connected to nasal cannula oxygen Cardiovascular status: blood pressure returned to baseline and stable Postop Assessment: no apparent nausea or vomiting Anesthetic complications: no    Last Vitals:  Vitals:   10/24/18 1535 10/24/18 1545  BP: (!) 157/73 (!) 157/76  Pulse: 65 62  Resp: 16 16  Temp:    SpO2: 98% 98%    Last Pain:  Vitals:   10/24/18 1545  TempSrc:   PainSc: 0-No pain                 Barnet Glasgow

## 2018-10-24 NOTE — Anesthesia Preprocedure Evaluation (Signed)
Anesthesia Evaluation  Patient identified by MRN, date of birth, ID band Patient awake    Reviewed: Allergy & Precautions, NPO status , Patient's Chart, lab work & pertinent test results  Airway Mallampati: II  TM Distance: >3 FB Neck ROM: Full    Dental no notable dental hx. (+) Teeth Intact   Pulmonary neg pulmonary ROS,    Pulmonary exam normal breath sounds clear to auscultation       Cardiovascular hypertension, Pt. on medications Normal cardiovascular exam Rhythm:Regular Rate:Normal     Neuro/Psych negative neurological ROS  negative psych ROS   GI/Hepatic negative GI ROS, Neg liver ROS, GERD  ,  Endo/Other  negative endocrine ROS  Renal/GU negative Renal ROSK+ 3.9 Cr 0.82  negative genitourinary   Musculoskeletal negative musculoskeletal ROS (+)   Abdominal   Peds  Hematology negative hematology ROS (+) Hgb 11.5 Plt 194   Anesthesia Other Findings   Reproductive/Obstetrics                             Anesthesia Physical Anesthesia Plan  ASA: II  Anesthesia Plan: General   Post-op Pain Management:    Induction: Intravenous  PONV Risk Score and Plan: 3 and Treatment may vary due to age or medical condition, Ondansetron and Dexamethasone  Airway Management Planned: Oral ETT  Additional Equipment:   Intra-op Plan:   Post-operative Plan: Extubation in OR  Informed Consent: I have reviewed the patients History and Physical, chart, labs and discussed the procedure including the risks, benefits and alternatives for the proposed anesthesia with the patient or authorized representative who has indicated his/her understanding and acceptance.     Dental advisory given  Plan Discussed with: CRNA  Anesthesia Plan Comments:         Anesthesia Quick Evaluation                                  Anesthesia Evaluation  Patient identified by MRN, date of birth, ID  band Patient awake    Reviewed: Allergy & Precautions, NPO status , Patient's Chart, lab work & pertinent test results  Airway Mallampati: I  TM Distance: >3 FB Neck ROM: Full    Dental   Pulmonary    Pulmonary exam normal        Cardiovascular hypertension, Pt. on medications Normal cardiovascular exam     Neuro/Psych    GI/Hepatic GERD  Medicated and Controlled,  Endo/Other    Renal/GU      Musculoskeletal   Abdominal   Peds  Hematology   Anesthesia Other Findings   Reproductive/Obstetrics                             Anesthesia Physical Anesthesia Plan  ASA: II  Anesthesia Plan: General   Post-op Pain Management:    Induction: Intravenous  PONV Risk Score and Plan: 3 and Ondansetron, Treatment may vary due to age or medical condition and Midazolam  Airway Management Planned: Oral ETT  Additional Equipment:   Intra-op Plan:   Post-operative Plan: Extubation in OR  Informed Consent: I have reviewed the patients History and Physical, chart, labs and discussed the procedure including the risks, benefits and alternatives for the proposed anesthesia with the patient or authorized representative who has indicated his/her understanding and acceptance.       Plan  Discussed with: CRNA and Surgeon  Anesthesia Plan Comments:         Anesthesia Quick Evaluation

## 2018-10-24 NOTE — Plan of Care (Signed)
  Problem: Clinical Measurements: Goal: Postoperative complications will be avoided or minimized Outcome: Progressing   Problem: Skin Integrity: Goal: Demonstration of wound healing without infection will improve Outcome: Progressing   

## 2018-10-24 NOTE — Plan of Care (Signed)
  Problem: Pain Managment: Goal: General experience of comfort will improve Outcome: Progressing   

## 2018-10-24 NOTE — Transfer of Care (Signed)
Immediate Anesthesia Transfer of Care Note  Patient: Tina Mccoy  Procedure(s) Performed: ENDOSCOPIC RETROGRADE CHOLANGIOPANCREATOGRAPHY (ERCP) (N/A )  Patient Location: Endoscopy Unit  Anesthesia Type:General  Level of Consciousness: drowsy  Airway & Oxygen Therapy: Patient Spontanous Breathing and Patient connected to nasal cannula oxygen  Post-op Assessment: Report given to RN and Post -op Vital signs reviewed and stable  Post vital signs: Reviewed and stable  Last Vitals:  Vitals Value Taken Time  BP 135/70 10/24/18 1517  Temp 36.5 C 10/24/18 1517  Pulse 61 10/24/18 1519  Resp 12 10/24/18 1519  SpO2 100 % 10/24/18 1519  Vitals shown include unvalidated device data.  Last Pain:  Vitals:   10/24/18 1517  TempSrc: Temporal  PainSc: Asleep      Patients Stated Pain Goal: 2 (58/59/29 2446)  Complications: No apparent anesthesia complications

## 2018-10-24 NOTE — Progress Notes (Signed)
[facility]/Trauma Progress Note  1 Day Post-Op   Assessment/Plan Acute cholecystitis - S/P lap chole with positive IOC, Dr. Corliss Skains, 10/12 - GI planning ERCP today - MRCP today showed 3 mm round lesion in the distal CBD - LFT's up slightly from yesterday, Tbili 0.6  FEN: N.p.o., IV fluids VTE: SCD's, ok top resume heparin after ERCP per GI ID: Merrem 10/11>> Foley: None Follow up: TBD   LOS: 2 days    Subjective: CC: abdominal pain  Pain better than last night. Pt has been walking and urinating. No flatus. Vomiting with oxycodone otherwise not N or V. Not been using IS.   Objective: Vital signs in last 24 hours: Temp:  [97.2 F (36.2 C)-98.4 F (36.9 C)] 98.4 F (36.9 C) (10/13 0623) Pulse Rate:  [66-87] 70 (10/13 0623) Resp:  [12-18] 18 (10/13 0623) BP: (105-146)/(56-80) 126/63 (10/13 0623) SpO2:  [93 %-100 %] 93 % (10/13 0623) Last BM Date: 10/22/18  Intake/Output from previous day: 10/12 0701 - 10/13 0700 In: 3136.9 [P.O.:120; I.V.:2616.9; IV Piggyback:400] Out: 310 [Urine:300; Blood:10] Intake/Output this shift: No intake/output data recorded.  PE: Gen:  Alert, NAD, pleasant, cooperative Pulm:  Rate and effort normal Abd: Soft, ND, +BS, incisions C/D/I, appropriately tender, no peritonitis  Skin: no rashes noted, warm and dry   Anti-infectives: Anti-infectives (From admission, onward)   Start     Dose/Rate Route Frequency Ordered Stop   10/22/18 2000  meropenem (MERREM) 1 g in sodium chloride 0.9 % 100 mL IVPB     1 g 200 mL/hr over 30 Minutes Intravenous Every 8 hours 10/22/18 1927        Lab Results:  Recent Labs    10/23/18 0334 10/24/18 0343  WBC 6.2 11.4*  HGB 12.3 11.5*  HCT 36.5 34.2*  PLT 209 194   BMET Recent Labs    10/23/18 0334 10/24/18 0343  NA 138 138  K 3.4* 3.9  CL 104 104  CO2 26 25  GLUCOSE 99 128*  BUN 10 5*  CREATININE 0.85 0.82  CALCIUM 8.6* 8.5*   PT/INR No results for input(s): LABPROT, INR  in the last 72 hours. CMP     Component Value Date/Time   NA 138 10/24/2018 0343   K 3.9 10/24/2018 0343   CL 104 10/24/2018 0343   CO2 25 10/24/2018 0343   GLUCOSE 128 (H) 10/24/2018 0343   BUN 5 (L) 10/24/2018 0343   CREATININE 0.82 10/24/2018 0343   CALCIUM 8.5 (L) 10/24/2018 0343   PROT 6.0 (L) 10/24/2018 0343   ALBUMIN 3.2 (L) 10/24/2018 0343   AST 146 (H) 10/24/2018 0343   ALT 252 (H) 10/24/2018 0343   ALKPHOS 179 (H) 10/24/2018 0343   BILITOT 0.6 10/24/2018 0343   GFRNONAA >60 10/24/2018 0343   GFRAA >60 10/24/2018 0343   Lipase  No results found for: LIPASE  Studies/Results: Dg Cholangiogram Operative  Result Date: 10/23/2018 CLINICAL DATA:  58 year-old undergoing laparoscopic cholecystectomy EXAM: INTRAOPERATIVE CHOLANGIOGRAM TECHNIQUE: Cholangiographic images from the C-arm fluoroscopic device were submitted for interpretation post-operatively. Please see the procedural report for the amount of contrast and the fluoroscopy time utilized. COMPARISON:  MRCP 10/23/2018 FINDINGS: Cine clips are submitted for review. The images demonstrate cannulation of the cystic duct remnant and intraoperative cholangiogram. There is diffuse mild dilation of the common bile and common hepatic ducts. A small rounded filling defect is present in the common bile duct consistent with choledocholithiasis. The stone is at least partially obstructing. No contrast  material is seen entering the duodenum. IMPRESSION: 1. Positive for choledocholithiasis. A single small stone is present in the distal common bile duct. 2. Associated extrahepatic and mild intrahepatic biliary ductal dilatation. Electronically Signed   By: Jacqulynn Cadet M.D.   On: 10/23/2018 14:59   Mr Abdomen Mrcp Tina Mccoy Contast  Result Date: 10/23/2018 CLINICAL DATA:  7 mL Gadavist and found to have acute cholecystitis on CT scan. Also common bile duct was dilated with elevated bilirubin and alkaline phosphatase.^54mL GADAVIST GADOBUTROL  1 MMOL/ML IV SOLNCholecystitis / cholangitis, known, follow up EXAM: MRI ABDOMEN WITHOUT AND WITH CONTRAST (INCLUDING MRCP) TECHNIQUE: Multiplanar multisequence MR imaging of the abdomen was performed both before and after the administration of intravenous contrast. Heavily T2-weighted images of the biliary and pancreatic ducts were obtained, and three-dimensional MRCP images were rendered by post processing. CONTRAST:  76mL GADAVIST GADOBUTROL 1 MMOL/ML IV SOLN COMPARISON:  CT 10/21/2018, ultrasound 10/23/2018 FINDINGS: Lower chest:  Lung bases are clear. Hepatobiliary: Multiple gallstones within lumen the gallbladder. Several small stones in the fundus and a large 1.8 cm stone towards the neck region. There is pericholecystic fluid present. The gallbladder is minimally distended 1.5 cm. The common hepatic duct and common bile duct are upper limits of normal at 6 mm. Within the distal common bile duct there is a round the signal abnormality on T2 weighted imaging (image 21/4) which is low signal intensity and measures 3 mm. This is seen on additional T2 weighted sequence (image 21/7). Lesion is not identified on the heavily T2 weighted MRCP sequences or T1 weighted imaging. Pancreas: Normal pancreatic parenchymal intensity. No ductal dilatation or inflammation. No pancreatic ductal variation. Spleen: Normal spleen. Adrenals/urinary tract: Adrenal glands and kidneys are normal. Stomach/Bowel: Stomach and limited of the small bowel is unremarkable Vascular/Lymphatic: Abdominal aortic normal caliber. No retroperitoneal periportal lymphadenopathy. Musculoskeletal: No aggressive osseous lesion IMPRESSION: 1. Small 3 mm round lesion in the distal common bile duct seen on the axial T2 weighted imaging but not on MRCP sequences. Findings concerning for but not definitive for a small distal common bile duct stone. No intrahepatic or extrahepatic biliary duct dilatation. 2. Multiple gallstones within the lumen of the  gallbladder and gallbladder wall thickening with pericholecystic fluid corresponds to findings on comparison CT and ultrasound. Electronically Signed   By: Suzy Bouchard M.D.   On: 10/23/2018 08:15   US Abdomen Limited Ruq  Result Date: 10/23/2018 CLINICAL DATA:  Abnormal CT abdominal pain with nausea and vomiting EXAM: ULTRASOUND ABDOMEN LIMITED RIGHT UPPER QUADRANT COMPARISON:  CT 10/21/2018 FINDINGS: Gallbladder: Numerous shadowing stones measuring up to 1.3 cm. Increased wall thickness up to 8.7 mm. Negative sonographic Murphy. Edema in the gallbladder wall versus trace pericholecystic fluid. Common bile duct: Diameter: Maximum diameter of 10.2 mm.  No shadowing stones. Liver: No focal lesion identified. Within normal limits in parenchymal echogenicity. Portal vein is patent on color Doppler imaging with normal direction of blood flow towards the liver. Other: None. IMPRESSION: 1. Cholelithiasis with wall thickening and gallbladder wall edema or small amount of pericholecystic fluid but negative sonographic Murphy. Findings still raise concern for an acute cholecystitis; nuclear medicine hepatobiliary imaging could be obtained further evaluation 2. Dilated common bile duct up to 10.2 mm. If ductal stone is suspected, MRCP could be obtained. Electronically Signed   By: Donavan Foil M.D.   On: 10/23/2018 00:48     Kalman Drape, [facility] Surgery Pager [phone] Myna Hidalgo, W, & Friday 7:00am - 4:30pm  Thursdays 7:00am -11:30am

## 2018-10-25 ENCOUNTER — Encounter (HOSPITAL_COMMUNITY): Payer: Self-pay | Admitting: General Practice

## 2018-10-25 ENCOUNTER — Other Ambulatory Visit: Payer: Self-pay

## 2018-10-25 DIAGNOSIS — K8042 Calculus of bile duct with acute cholecystitis without obstruction: Secondary | ICD-10-CM

## 2018-10-25 LAB — CBC
HCT: 32.7 % — ABNORMAL LOW (ref 36.0–46.0)
Hemoglobin: 10.7 g/dL — ABNORMAL LOW (ref 12.0–15.0)
MCH: 31.2 pg (ref 26.0–34.0)
MCHC: 32.7 g/dL (ref 30.0–36.0)
MCV: 95.3 fL (ref 80.0–100.0)
Platelets: 190 10*3/uL (ref 150–400)
RBC: 3.43 MIL/uL — ABNORMAL LOW (ref 3.87–5.11)
RDW: 12.7 % (ref 11.5–15.5)
WBC: 10.6 10*3/uL — ABNORMAL HIGH (ref 4.0–10.5)
nRBC: 0 % (ref 0.0–0.2)

## 2018-10-25 LAB — COMPREHENSIVE METABOLIC PANEL
ALT: 185 U/L — ABNORMAL HIGH (ref 0–44)
AST: 78 U/L — ABNORMAL HIGH (ref 15–41)
Albumin: 2.9 g/dL — ABNORMAL LOW (ref 3.5–5.0)
Alkaline Phosphatase: 150 U/L — ABNORMAL HIGH (ref 38–126)
Anion gap: 8 (ref 5–15)
BUN: 5 mg/dL — ABNORMAL LOW (ref 6–20)
CO2: 25 mmol/L (ref 22–32)
Calcium: 8.2 mg/dL — ABNORMAL LOW (ref 8.9–10.3)
Chloride: 106 mmol/L (ref 98–111)
Creatinine, Ser: 0.77 mg/dL (ref 0.44–1.00)
GFR calc Af Amer: >60 mL/min (ref >60)
GFR calc non Af Amer: >60 mL/min (ref >60)
Glucose, Bld: 131 mg/dL — ABNORMAL HIGH (ref 70–99)
Potassium: 3.8 mmol/L (ref 3.5–5.1)
Sodium: 139 mmol/L (ref 135–145)
Total Bilirubin: 0.8 mg/dL (ref 0.3–1.2)
Total Protein: 5.7 g/dL — ABNORMAL LOW (ref 6.5–8.1)

## 2018-10-25 LAB — LIPASE, BLOOD: Lipase: 22 U/L (ref 11–51)

## 2018-10-25 NOTE — Discharge Summary (Signed)
Physician Discharge Summary  Tina Mccoy ZOX:[id number] DOB: 14-Dec-1960 DOA: 10/22/2018  PCP: Eldridge AbrahamsPomposini, Daniel L, MD  Admit date: 10/22/2018 Discharge date: 10/25/2018  Recommendations for Outpatient Follow-up:  1. Follow up with General Surgery as directed. 2. Follow up with PCP in 7-10 days. 3. No driving until cleared by PCP.   [facility] Information    [facility] [place].   Specialty: General Surgery Why: 10/27 at 10:15 for follow up appointment. Please arrive 20 min prior to complete paperwork. Please bring photo ID and insurance card Contact information: [address] [place] [id number] [phone]           Discharge Diagnoses: Principal diagnosis is #1 1. Choledocholithiasis 2. Normocytic anemia  Discharge Condition: Fair Disposition: Home  Diet recommendation: Heart healthy  Filed Weights   10/24/18 1246  Weight: 70.8 kg    History of present illness:   Tina Mccoy is a 58 y.o. female with medical history significant of GERD, hypertension, hyperlipidemia [facility] with abdominal pain nausea and vomiting.  Patient was evaluated and found to have acute cholecystitis on CT scan.  Also common bile duct was dilated with elevated bilirubin and alkaline phosphatase.  Lipase is within normal.  No fever and no leukocytosis.  Patient was recommended to have MRCP, surgical consultation and possibly gastroenterology consultation which [facility].  Also ultrasound not readily available.  She was therefore sent here for further evaluation.Marland Kitchen.  Hospital Course:  Tina Pontvonda Thorntonis a 58 y.o.femalewith medical history significant ofGERD, hypertension, hyperlipidemia [facility] presentation with abdominal pain nausea and vomiting. Patient was evaluated and found to have acute cholecystitis on CT scan.  Also common bile duct was dilated with elevated bilirubin and alkaline phosphatase. Lipase is within normal. No fever and no leukocytosis. Patient was recommended to have MRCP, surgical consultation and possibly gastroenterology consultation which [facility]. Also ultrasound not readily available. She was therefore sent here for further evaluation.MRI did show a 3 mm stone in the distal common bile duct findings of acute cholecystitis including gallstones, gallbladder wall thickening,pericholecysticfluid. She was taken for lap cholecystectomy on 10/12 where diagnostic cholangiogram did confirm choledocholithiasis. Underwent ERCP today (10/13) which was successful.  Antibiotics stopped by GI after the procedure.  She is to be monitored overnight, diet advanced, and likely discharged in the AM.   Today's assessment: S: The patient is resting comfortably. No new complaints. O: Vitals:  Vitals:   10/24/18 2049 10/25/18 0529  BP: 130/70 (!) 153/77  Pulse: 63 60  Resp: 16 16  Temp: 98.1 F (36.7 C) 98 F (36.7 C)  SpO2: 97% 98%    Exam:  Constitutional:   The patient is awake, alert, and oriented x 3. No acute distress. Respiratory:   No increased work of breathing.  No wheezes, rales, or rhonchi  No tactile fremitus Cardiovascular:   Regular rate and rhythm  No murmurs, ectopy, or gallups.  No lateral PMI. No thrills. Abdomen:   Abdomen is soft, non-tender, non-distended  No hernias, masses, or organomegaly  Normoactive bowel sounds.  Musculoskeletal:   No cyanosis, clubbing, or edema Skin:   No rashes, lesions, ulcers  palpation of skin: no induration or nodules Neurologic:   CN 2-12 intact  Sensation all 4 extremities intact Psychiatric:   Mental status o Mood, affect appropriate o Orientation to  person, place, time   judgment and insight appear intact  Discharge Instructions  Discharge Instructions    Activity as  tolerated - No restrictions   Complete by: As directed    No driving until cleared by PCP.   Call MD for:  persistant nausea and vomiting   Complete by: As directed    Call MD for:  severe uncontrolled pain   Complete by: As directed    Diet - low sodium heart healthy   Complete by: As directed    Discharge instructions   Complete by: As directed    Follow up with general surgery as directed. Follow up with PCP in 7-10 days.   Increase activity slowly   Complete by: As directed      Allergies as of 10/25/2018      Reactions   Codeine Nausea Only      Medication List    STOP taking these medications   Probiotic Advanced Caps     TAKE these medications   docusate sodium 100 MG capsule Commonly known as: COLACE Take 100 mg by mouth daily.   Linzess 72 MCG capsule Generic drug: linaclotide Take 72 mcg by mouth daily as needed (for constipation).   lisinopril 20 MG tablet Commonly known as: ZESTRIL Take 20 mg by mouth daily.   omeprazole 40 MG capsule Commonly known as: PRILOSEC Take 40 mg by mouth daily before breakfast.   simvastatin 20 MG tablet Commonly known as: ZOCOR Take 20 mg by mouth daily.   VITAMIN B-12 PO Take 1 tablet by mouth daily with breakfast.   Vitamin D-3 25 MCG (1000 UT) Caps Take 1,000 Units by mouth daily with breakfast.      Allergies  Allergen Reactions   Codeine Nausea Only    The results of significant diagnostics from this hospitalization (including imaging, microbiology, ancillary and laboratory) are listed below for reference.    Significant Diagnostic Studies: Dg Cholangiogram Operative  Result Date: 10/23/2018 CLINICAL DATA:  58 year-old undergoing laparoscopic cholecystectomy EXAM: INTRAOPERATIVE CHOLANGIOGRAM TECHNIQUE: Cholangiographic images from the C-arm fluoroscopic device were submitted for interpretation post-operatively. Please see the procedural report for the amount of contrast and the fluoroscopy time  utilized. COMPARISON:  MRCP 10/23/2018 FINDINGS: Cine clips are submitted for review. The images demonstrate cannulation of the cystic duct remnant and intraoperative cholangiogram. There is diffuse mild dilation of the common bile and common hepatic ducts. A small rounded filling defect is present in the common bile duct consistent with choledocholithiasis. The stone is at least partially obstructing. No contrast material is seen entering the duodenum. IMPRESSION: 1. Positive for choledocholithiasis. A single small stone is present in the distal common bile duct. 2. Associated extrahepatic and mild intrahepatic biliary ductal dilatation. Electronically Signed   By: Jacqulynn Cadet M.D.   On: 10/23/2018 14:59   Dg Ercp Biliary & Pancreatic Ducts  Result Date: 10/24/2018 CLINICAL DATA:  Common bile duct stone removal EXAM: ERCP TECHNIQUE: Multiple spot images obtained with the fluoroscopic device and submitted for interpretation post-procedure. COMPARISON:  Intra op cholangiogram from previous day FINDINGS: A series of fluoroscopic spot images document endoscopic cannulation and opacification of the CBD with passage of a balloon catheter into the CBD. Incomplete opacification of the intrahepatic biliary tree, which appears decompressed centrally. Cholecystectomy clips. No leak or extravasation identified. IMPRESSION: Endoscopic CBD cannulation and intervention. These images were submitted for radiologic interpretation only. Please see the procedural report for the amount of contrast and the fluoroscopy time utilized. Electronically  Signed   By: Corlis Leak M.D.   On: 10/24/2018 15:32   Mr Abdomen Mrcp Vivien Rossetti Contast  Result Date: 10/23/2018 CLINICAL DATA:  7 mL Gadavist and found to have acute cholecystitis on CT scan. Also common bile duct was dilated with elevated bilirubin and alkaline phosphatase.^54mL GADAVIST GADOBUTROL 1 MMOL/ML IV SOLNCholecystitis / cholangitis, known, follow up EXAM: MRI ABDOMEN  WITHOUT AND WITH CONTRAST (INCLUDING MRCP) TECHNIQUE: Multiplanar multisequence MR imaging of the abdomen was performed both before and after the administration of intravenous contrast. Heavily T2-weighted images of the biliary and pancreatic ducts were obtained, and three-dimensional MRCP images were rendered by post processing. CONTRAST:  75mL GADAVIST GADOBUTROL 1 MMOL/ML IV SOLN COMPARISON:  CT 10/21/2018, ultrasound 10/23/2018 FINDINGS: Lower chest:  Lung bases are clear. Hepatobiliary: Multiple gallstones within lumen the gallbladder. Several small stones in the fundus and a large 1.8 cm stone towards the neck region. There is pericholecystic fluid present. The gallbladder is minimally distended 1.5 cm. The common hepatic duct and common bile duct are upper limits of normal at 6 mm. Within the distal common bile duct there is a round the signal abnormality on T2 weighted imaging (image 21/4) which is low signal intensity and measures 3 mm. This is seen on additional T2 weighted sequence (image 21/7). Lesion is not identified on the heavily T2 weighted MRCP sequences or T1 weighted imaging. Pancreas: Normal pancreatic parenchymal intensity. No ductal dilatation or inflammation. No pancreatic ductal variation. Spleen: Normal spleen. Adrenals/urinary tract: Adrenal glands and kidneys are normal. Stomach/Bowel: Stomach and limited of the small bowel is unremarkable Vascular/Lymphatic: Abdominal aortic normal caliber. No retroperitoneal periportal lymphadenopathy. Musculoskeletal: No aggressive osseous lesion IMPRESSION: 1. Small 3 mm round lesion in the distal common bile duct seen on the axial T2 weighted imaging but not on MRCP sequences. Findings concerning for but not definitive for a small distal common bile duct stone. No intrahepatic or extrahepatic biliary duct dilatation. 2. Multiple gallstones within the lumen of the gallbladder and gallbladder wall thickening with pericholecystic fluid corresponds to  findings on comparison CT and ultrasound. Electronically Signed   By: Genevive Bi M.D.   On: 10/23/2018 08:15   US Abdomen Limited Ruq  Result Date: 10/23/2018 CLINICAL DATA:  Abnormal CT abdominal pain with nausea and vomiting EXAM: ULTRASOUND ABDOMEN LIMITED RIGHT UPPER QUADRANT COMPARISON:  CT 10/21/2018 FINDINGS: Gallbladder: Numerous shadowing stones measuring up to 1.3 cm. Increased wall thickness up to 8.7 mm. Negative sonographic Murphy. Edema in the gallbladder wall versus trace pericholecystic fluid. Common bile duct: Diameter: Maximum diameter of 10.2 mm.  No shadowing stones. Liver: No focal lesion identified. Within normal limits in parenchymal echogenicity. Portal vein is patent on color Doppler imaging with normal direction of blood flow towards the liver. Other: None. IMPRESSION: 1. Cholelithiasis with wall thickening and gallbladder wall edema or small amount of pericholecystic fluid but negative sonographic Murphy. Findings still raise concern for an acute cholecystitis; nuclear medicine hepatobiliary imaging could be obtained further evaluation 2. Dilated common bile duct up to 10.2 mm. If ductal stone is suspected, MRCP could be obtained. Electronically Signed   By: Jasmine Pang M.D.   On: 10/23/2018 00:48    Microbiology: Recent Results (from the past 240 hour(s))  SARS CORONAVIRUS 2 (TAT 6-24 HRS) Nasopharyngeal Nasopharyngeal Swab     Status: None   Collection Time: 10/23/18  3:56 AM   Specimen: Nasopharyngeal Swab  Result Value Ref Range Status   SARS Coronavirus 2 NEGATIVE NEGATIVE Final  Comment: (NOTE) SARS-CoV-2 target nucleic acids are NOT DETECTED. The SARS-CoV-2 RNA is generally detectable in upper and lower respiratory specimens during the acute phase of infection. Negative results do not preclude SARS-CoV-2 infection, do not rule out co-infections with other pathogens, and should not be used as the sole basis for treatment or other patient management  decisions. Negative results must be combined with clinical observations, patient history, and epidemiological information. The expected result is Negative. Fact Sheet for Patients: HairSlick.no Fact Sheet for Healthcare Providers: quierodirigir.com This test is not yet approved or cleared by the [place] FDA and  has been authorized for detection and/or diagnosis of SARS-CoV-2 by FDA under an Emergency Use Authorization (EUA). This EUA will remain  in effect (meaning this test can be used) for the duration of the COVID-19 declaration under Section 56 4(b)(1) of the Act, 21 U.S.C. section 360bbb-3(b)(1), unless the authorization is terminated or revoked sooner. Performed at [facility], [address] [place], [place] [address]      Labs: Basic Metabolic Panel: Recent Labs  Lab 10/22/18 1925 10/23/18 0334 10/24/18 0343 10/25/18 0150  NA 138 138 138 139  K 3.8 3.4* 3.9 3.8  CL 100 104 104 106  CO2 GLUCOSE 103* 99 128* 131*  BUN 10 10 5* 5*  CREATININE 0.96 0.85 0.82 0.77  CALCIUM 9.1 8.6* 8.5* 8.2*   Liver Function Tests: Recent Labs  Lab 10/22/18 1925 10/23/18 0334 10/24/18 0343 10/25/18 0150  AST 161* 138* 146* 78*  ALT 345* 293* 252* 185*  ALKPHOS 207* 219* 179* 150*  BILITOT 0.9 0.9 0.6 0.8  PROT 6.5 6.4* 6.0* 5.7*  ALBUMIN 3.8 3.5 3.2* 2.9*   Recent Labs  Lab 10/25/18 0150  LIPASE 22   No results for input(s): AMMONIA in the last 168 hours. CBC: Recent Labs  Lab 10/22/18 1925 10/23/18 0334 10/24/18 0343 10/25/18 0150  WBC 8.4 6.2 11.4* 10.6*  NEUTROABS 6.3  --  9.5*  --   HGB 13.0 12.3 11.5* 10.7*  HCT 38.5 36.5 34.2* 32.7*  MCV 95.3 94.6 96.1 95.3  PLT 226 209 194 190   Cardiac Enzymes: No results for input(s): CKTOTAL, CKMB, CKMBINDEX, TROPONINI in the last 168 hours. BNP: BNP (last 3 results) No results for input(s): BNP in the last 8760 hours.  ProBNP  (last 3 results) No results for input(s): PROBNP in the last 8760 hours.  CBG: No results for input(s): GLUCAP in the last 168 hours.  Principal Problem:   Choledocholithiasis with acute cholecystitis Active Problems:   GERD (gastroesophageal reflux disease)   Benign essential HTN   Acute cholecystitis   Constipation   Time coordinating discharge: 38 minutes  Signed:        Anay Rathe, DO [facility]  10/25/2018, 6:45 PM

## 2018-10-25 NOTE — Discharge Instructions (Signed)
Please arrive at least 20 min before your appointment to complete your check in paperwork.  If you are unable to arrive 30 min prior to your appointment time we may have to cancel or reschedule you.  LAPAROSCOPIC SURGERY: POST OP INSTRUCTIONS  1. DIET: Follow a light bland diet the first 48 hours after arrival home, such as soup, liquids, crackers, etc. Be sure to include lots of fluids daily. Avoid fast food or heavy meals as your are more likely to get nauseated. Eat a low fat the next few days after surgery.  2. Take your usually prescribed home medications unless otherwise directed. 3. PAIN CONTROL:  1. Pain is best controlled by a usual combination of three different methods TOGETHER:  1. Ice/Heat 2. Over the counter pain medication 3. Prescription pain medication 2. Most patients will experience some swelling and bruising around the incisions. Ice packs or heating pads (30-60 minutes up to 6 times a day) will help. Use ice for the first few days to help decrease swelling and bruising, then switch to heat to help relax tight/sore spots and speed recovery. Some people prefer to use ice alone, heat alone, alternating between ice & heat. Experiment to what works for you. Swelling and bruising can take several weeks to resolve.  3. It is helpful to take an over-the-counter pain medication regularly for the first few weeks. Choose one of the following that works best for you:  1. Naproxen (Aleve, etc) Two 220mg  tabs twice a day 2. Ibuprofen (Advil, etc) Three 200mg  tabs four times a day (every meal & bedtime) 3. Acetaminophen (Tylenol, etc) 500-650mg  four times a day (every meal & bedtime) 4. A prescription for pain medication (such as oxycodone, hydrocodone, etc) should be given to you upon discharge. Take your pain medication as prescribed.  1. If you are having problems/concerns with the prescription medicine (does not control pain, nausea, vomiting, rash, itching, etc), please call us [phone]  [phone] to see if we need to switch you to a different pain medicine that will work better for you and/or control your side effect better. 2. If you need a refill on your pain medication, please contact your pharmacy. They will contact our office to request authorization. Prescriptions will not be filled after 5 pm or on week-ends. 4. Avoid getting constipated. Between the surgery and the pain medications, it is common to experience some constipation. Increasing fluid intake and taking a fiber supplement (such as Metamucil, Citrucel, FiberCon, MiraLax, etc) 1-2 times a day regularly will usually help prevent this problem from occurring. A mild laxative (prune juice, Milk of Magnesia, MiraLax, etc) should be taken according to package directions if there are no bowel movements after 48 hours.  5. Watch out for diarrhea. If you have many loose bowel movements, simplify your diet to bland foods & liquids for a few days. Stop any stool softeners and decrease your fiber supplement. Switching to mild anti-diarrheal medications (Kayopectate, Pepto Bismol) can help. If this worsens or does not improve, please call us. 6. Wash / shower every day. You may shower over the dressings as they are waterproof. Continue to shower over incision(s) after the dressing is off. 7. Remove your waterproof bandages 5 days after surgery. You may leave the incision open to air. You may replace a dressing/Band-Aid to cover the incision for comfort if you wish.  8. ACTIVITIES as tolerated:  1. You may resume regular (light) daily activities beginning the next day--such as daily self-care, walking, climbing stairs--gradually  increasing activities as tolerated. If you can walk 30 minutes without difficulty, it is safe to try more intense activity such as jogging, treadmill, bicycling, low-impact aerobics, swimming, etc. °2. Save the most intensive and strenuous activity for last such as sit-ups, heavy lifting, contact sports, etc Refrain  from any heavy lifting or straining until you are off narcotics for pain control.  °3. DO NOT PUSH THROUGH PAIN. Let pain be your guide: If it hurts to do something, don't do it. Pain is your body warning you to avoid that activity for another week until the pain goes down. °4. You may drive when you are no longer taking prescription pain medication, you can comfortably wear a seatbelt, and you can safely maneuver your car and apply brakes. °5. You may have sexual intercourse when it is comfortable.  °9. FOLLOW UP in our office  °1. Please call [facility] at [phone] to set up an appointment to see your surgeon in the office for a follow-up appointment approximately 2-3 weeks after your surgery. °2. Make sure that you call for this appointment the day you arrive home to insure a convenient appointment time. °     10. IF YOU HAVE DISABILITY OR FAMILY LEAVE FORMS, BRING THEM TO THE               OFFICE FOR PROCESSING.  ° °WHEN TO CALL US [phone]:  °1. Poor pain control °2. Reactions / problems with new medications (rash/itching, nausea, etc)  °3. Fever over 101.5 F (38.5 C) °4. Inability to urinate °5. Nausea and/or vomiting °6. Worsening swelling or bruising °7. Continued bleeding from incision. °8. Increased pain, redness, or drainage from the incision ° °The clinic staff is available to answer your questions during regular business hours (8:30am-5pm). Please don’t hesitate to call and ask to speak to one of our nurses for clinical concerns.  °If you have a medical emergency, go to the nearest emergency room or call 911.  °A surgeon from [facility] is always on call at the hospitals  ° °[facility]  °[address], [address], [place], [place] [address] ?  °MAIN: [phone] ? TOLL FREE: [phone] ?  °FAX [phone]  °[email or web address] ° °

## 2018-10-25 NOTE — Progress Notes (Signed)
Daily Rounding Note  10/25/2018, 9:41 AM  LOS: 3 days   SUBJECTIVE:   Chief complaint: Choledocholithiasis.  Status post lap chole. She is doing well.  She is getting her first solid meal, hashbrowns, eggs, bacon, toast and really enjoying this.  Has not had a bowel movement.  Some postoperative discomfort in the right abdomen but no severe pain.  OBJECTIVE:         Vital signs in last 24 hours:    Temp:  [97.7 F (36.5 C)-98.6 F (37 C)] 98 F (36.7 C) (10/14 0529) Pulse Rate:  [60-71] 60 (10/14 0529) Resp:  [15-18] 16 (10/14 0529) BP: (130-168)/(65-77) 153/77 (10/14 0529) SpO2:  [94 %-100 %] 98 % (10/14 0529) Weight:  [70.8 kg] 70.8 kg (10/13 1246) Last BM Date: 10/22/18 Filed Weights   10/24/18 1246  Weight: 70.8 kg   General: Looks great.  Comfortable.  Icterus or jaundice Heart: RRR Chest: No labored breathing.  Lungs clear bilaterally. Abdomen: Active bowel sounds.  Soft, nondistended.  Some tenderness in the right upper quadrant without guarding or rebound. Extremities: No CCE. Neuro/Psych: Fully alert and oriented.  In good spirits.  Intake/Output from previous day: 10/13 0701 - 10/14 0700 In: 2425.7 [P.O.:480; I.V.:1945.7] Out: 305 [Urine:300; Blood:5]  Intake/Output this shift: No intake/output data recorded.  Lab Results: Recent Labs    10/23/18 0334 10/24/18 0343 10/25/18 0150  WBC 6.2 11.4* 10.6*  HGB 12.3 11.5* 10.7*  HCT 36.5 34.2* 32.7*  PLT 209 194 190   BMET Recent Labs    10/23/18 0334 10/24/18 0343 10/25/18 0150  NA 138 138 139  K 3.4* 3.9 3.8  CL 104 104 106  CO2 26 25 25   GLUCOSE 99 128* 131*  BUN 10 5* 5*  CREATININE 0.85 0.82 0.77  CALCIUM 8.6* 8.5* 8.2*   LFT Recent Labs    10/23/18 0334 10/24/18 0343 10/25/18 0150  PROT 6.4* 6.0* 5.7*  ALBUMIN 3.5 3.2* 2.9*  AST 138* 146* 78*  ALT 293* 252* 185*  ALKPHOS 219* 179* 150*  BILITOT 0.9 0.6 0.8   PT/INR  No results for input(s): LABPROT, INR in the last 72 hours. Hepatitis Panel No results for input(s): HEPBSAG, HCVAB, HEPAIGM, HEPBIGM in the last 72 hours.  Studies/Results: Dg Cholangiogram Operative  Result Date: 10/23/2018 CLINICAL DATA:  58 year-old undergoing laparoscopic cholecystectomy EXAM: INTRAOPERATIVE CHOLANGIOGRAM TECHNIQUE: Cholangiographic images from the C-arm fluoroscopic device were submitted for interpretation post-operatively. Please see the procedural report for the amount of contrast and the fluoroscopy time utilized. COMPARISON:  MRCP 10/23/2018 FINDINGS: Cine clips are submitted for review. The images demonstrate cannulation of the cystic duct remnant and intraoperative cholangiogram. There is diffuse mild dilation of the common bile and common hepatic ducts. A small rounded filling defect is present in the common bile duct consistent with choledocholithiasis. The stone is at least partially obstructing. No contrast material is seen entering the duodenum. IMPRESSION: 1. Positive for choledocholithiasis. A single small stone is present in the distal common bile duct. 2. Associated extrahepatic and mild intrahepatic biliary ductal dilatation. Electronically Signed   By: Jacqulynn Cadet M.D.   On: 10/23/2018 14:59   Dg Ercp Biliary & Pancreatic Ducts  Result Date: 10/24/2018 CLINICAL DATA:  Common bile duct stone removal EXAM: ERCP TECHNIQUE: Multiple spot images obtained with the fluoroscopic device and submitted for interpretation post-procedure. COMPARISON:  Intra op cholangiogram from previous day FINDINGS: A series of fluoroscopic spot images document  endoscopic cannulation and opacification of the CBD with passage of a balloon catheter into the CBD. Incomplete opacification of the intrahepatic biliary tree, which appears decompressed centrally. Cholecystectomy clips. No leak or extravasation identified. IMPRESSION: Endoscopic CBD cannulation and intervention. These images  were submitted for radiologic interpretation only. Please see the procedural report for the amount of contrast and the fluoroscopy time utilized. Electronically Signed   By: Corlis Leak M.D.   On: 10/24/2018 15:32    ASSESMENT:   *   Choledocholithiasis. 10/24/2018 ERCP with biliary sphincterotomy, tiny filling defect.  No stone seen at balloon sweep, suspect stone washed out at the time of the sphincterotomy. Elevated LFTs, overall improved but not yet resolved.  *    Lap chole 10/23/2018.  Distal CBD at intraoperative cholangiogram.      *     Normocytic anemia, postoperative.   PLAN   *   No need for GI follow-up unless she is having issues.  Her GI physician is Dr. Samuella Cota in [place] but [facility] happy to see her if needed in future. Can obtain follow-up LFTs in a week or 2 with local MD.  Can check CBC in several week  *    Agree with discharging patient today.    Jennye Moccasin  10/25/2018, 9:41 AM Phone [phone]

## 2018-10-25 NOTE — Final Consult Note (Signed)
Consultant Final Sign-Off Note    Assessment/Final recommendations  Tekisha Darcey is a 58 y.o. female followed by me for cholecystitis.  S/p lap chole with positive IOC by Dr. Georgette Dover.  S/p ERCP for CBD stone.  As long as patient tolerates a diet today she will be okay for discharge from a surgical standpoint.   Wound care (if applicable): Steri-Strips on incisions.  No wound care needed.   Diet at discharge: [place] for 48 hours then regular diet   Activity at discharge: Restrictions: No lifting greater than 20 pounds for 4 weeks   Follow-up appointment: In [facility]   Pending results:  Unresulted Labs (From admission, onward)    Start     Ordered   10/24/18 0500  CBC  Tomorrow morning,   R     10/23/18 1637   10/22/18 1928  HIV4GL Save Tube  (HIV Antibody (Routine testing w reflex) panel)  Once,   R     10/22/18 1927           Medication recommendations: None   Other recommendations:    Thank you for allowing [place] to participate in the care of your patient!  Please consult [place] again if you have further needs for your patient.  Kalman Drape 10/25/2018 9:53 AM    Subjective   CC: Abdominal soreness  Patient denies abdominal pain.  She is only taking Tylenol.  She denies nausea, vomiting, fever or chills overnight.  She is tolerating clear liquids.  Objective  Vital signs in last 24 hours: Temp:  [97.7 F (36.5 C)-98.6 F (37 C)] 98 F (36.7 C) (10/14 0529) Pulse Rate:  [60-71] 60 (10/14 0529) Resp:  [15-18] 16 (10/14 0529) BP: (130-168)/(65-77) 153/77 (10/14 0529) SpO2:  [94 %-100 %] 98 % (10/14 0529) Weight:  [70.8 kg] 70.8 kg (10/13 1246)  PE: Gen:  Alert, NAD, pleasant, cooperative Pulm:  Rate and effort normal Abd: Soft, ND, +BS, incisions with Steri-Strips intact appear well and are without signs of infection or bleeding.  No TTP, no peritonitis  Skin: no rashes noted, warm and dry  Pertinent labs and Studies: Recent Labs    10/23/18 0334  10/24/18 0343 10/25/18 0150  WBC 6.2 11.4* 10.6*  HGB 12.3 11.5* 10.7*  HCT 36.5 34.2* 32.7*   BMET Recent Labs    10/24/18 0343 10/25/18 0150  NA 138 139  K 3.9 3.8  CL 104 106  CO2 25 25  GLUCOSE 128* 131*  BUN 5* 5*  CREATININE 0.82 0.77  CALCIUM 8.5* 8.2*   No results for input(s): LABURIN in the last 72 hours. Results for orders placed or performed during the hospital encounter of 10/22/18  SARS CORONAVIRUS 2 (TAT 6-24 HRS) Nasopharyngeal Nasopharyngeal Swab     Status: None   Collection Time: 10/23/18  3:56 AM   Specimen: Nasopharyngeal Swab  Result Value Ref Range Status   SARS Coronavirus 2 NEGATIVE NEGATIVE Final    Comment: (NOTE) SARS-CoV-2 target nucleic acids are NOT DETECTED. The SARS-CoV-2 RNA is generally detectable in upper and lower respiratory specimens during the acute phase of infection. Negative results do not preclude SARS-CoV-2 infection, do not rule out co-infections with other pathogens, and should not be used as the sole basis for treatment or other patient management decisions. Negative results must be combined with clinical observations, patient history, and epidemiological information. The expected result is Negative. Fact Sheet for Patients: SugarRoll.be Fact Sheet for Healthcare Providers: https://www.woods-mathews.com/ This test is not yet approved  or cleared by the [place] and  has been authorized for detection and/or diagnosis of SARS-CoV-2 by FDA under an Emergency Use Authorization (EUA). This EUA will remain  in effect (meaning this test can be used) for the duration of the COVID-19 declaration under Section 56 4(b)(1) of the Act, 21 U.S.C. section 360bbb-3(b)(1), unless the authorization is terminated or revoked sooner. Performed at [facility], [address] [place], [place] [address]     Imaging: Dg Ercp Biliary & Pancreatic Ducts  Result Date:  10/24/2018 CLINICAL DATA:  Common bile duct stone removal EXAM: ERCP TECHNIQUE: Multiple spot images obtained with the fluoroscopic device and submitted for interpretation post-procedure. COMPARISON:  Intra op cholangiogram from previous day FINDINGS: A series of fluoroscopic spot images document endoscopic cannulation and opacification of the CBD with passage of a balloon catheter into the CBD. Incomplete opacification of the intrahepatic biliary tree, which appears decompressed centrally. Cholecystectomy clips. No leak or extravasation identified. IMPRESSION: Endoscopic CBD cannulation and intervention. These images were submitted for radiologic interpretation only. Please see the procedural report for the amount of contrast and the fluoroscopy time utilized. Electronically Signed   By: Corlis Leak M.D.   On: 10/24/2018 15:32

## 2020-08-11 IMAGING — US US ABDOMEN LIMITED
1 series · 14 of 25 positions shown · non-contrast
Comparison: CT 10/21/2018

CLINICAL DATA: Abnormal CT abdominal pain with nausea and vomiting

EXAM:
ULTRASOUND ABDOMEN LIMITED RIGHT UPPER QUADRANT

[Series 1: us abdomen limited · 14 of 60 slices shown]
[im 1/60]
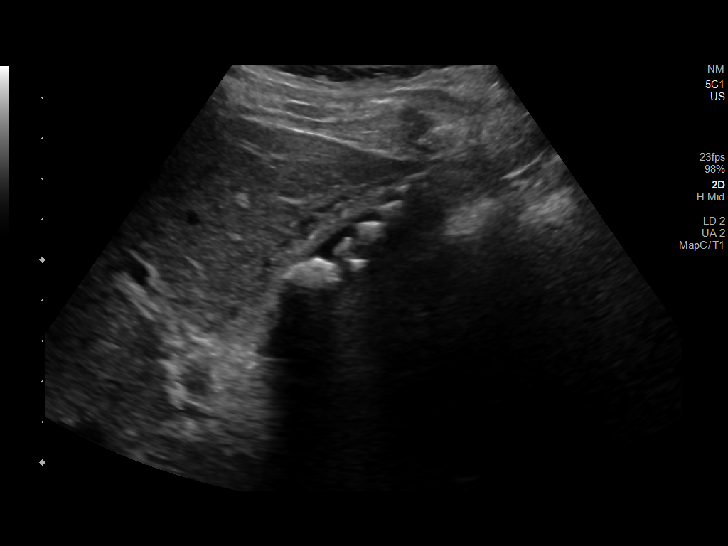
[im 5/60]
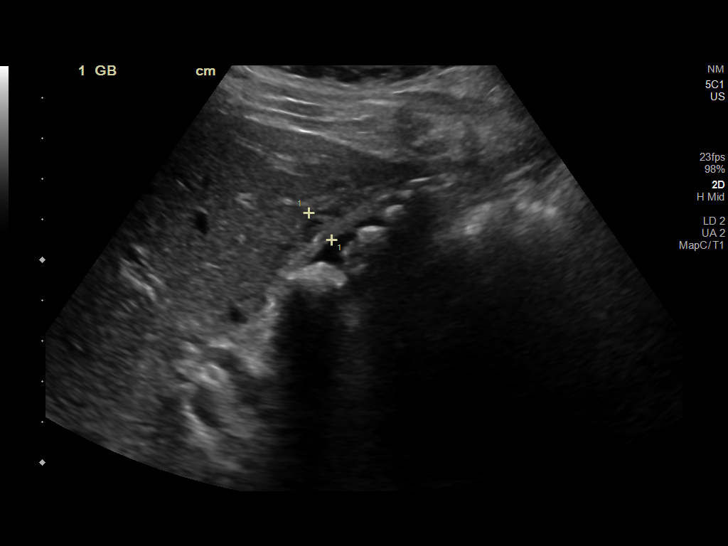
[im 10/60]
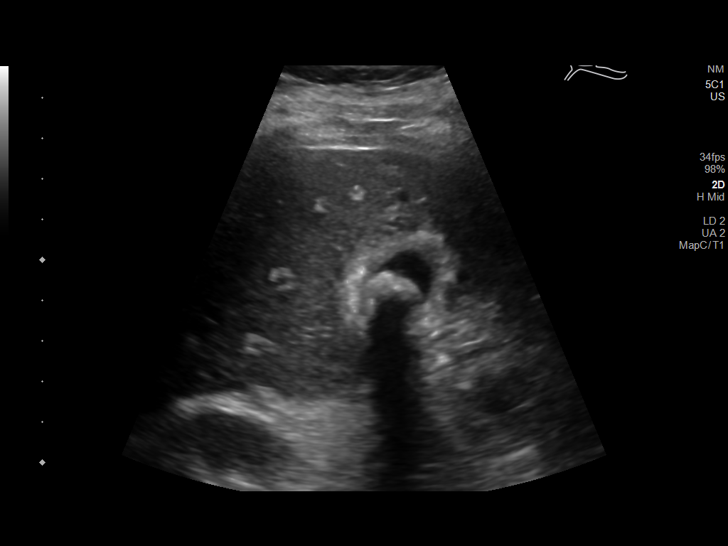
[im 15/60]
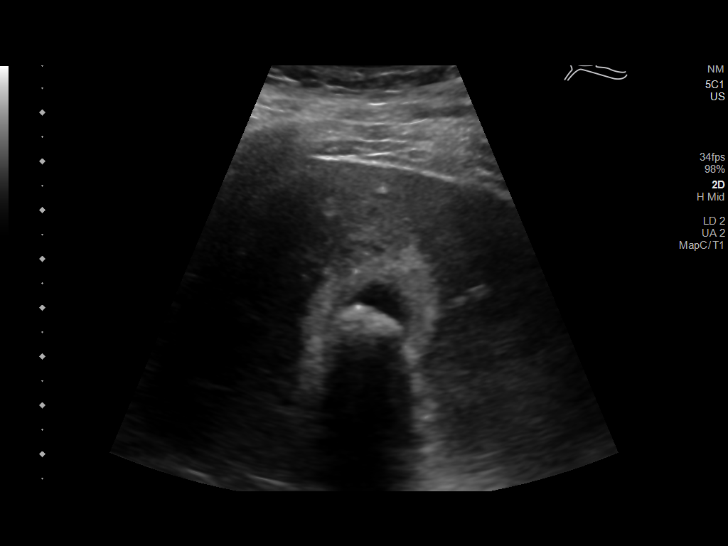
[im 20/60]
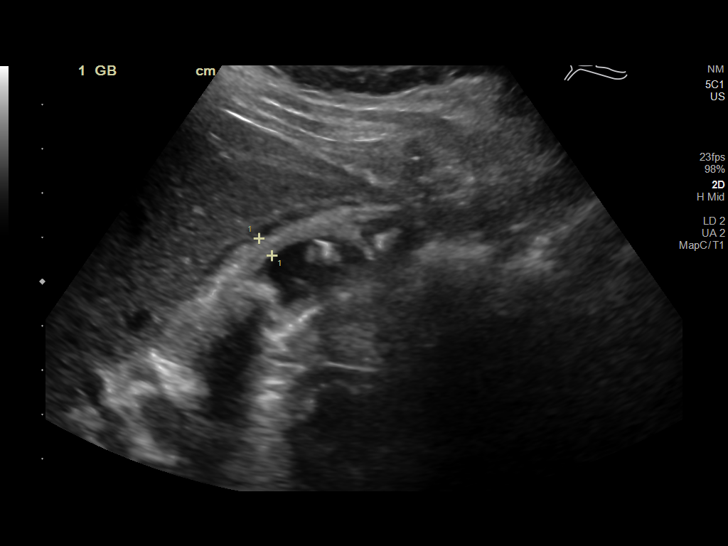
[im 23/60]
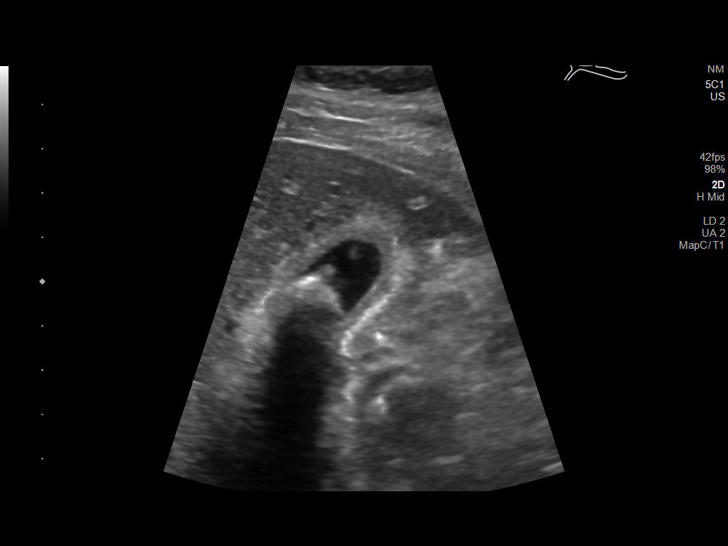
[im 28/60]
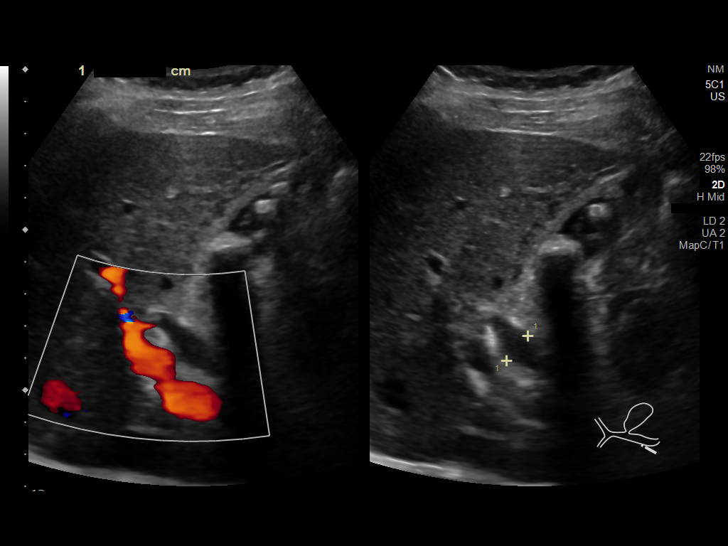
[im 32/60]
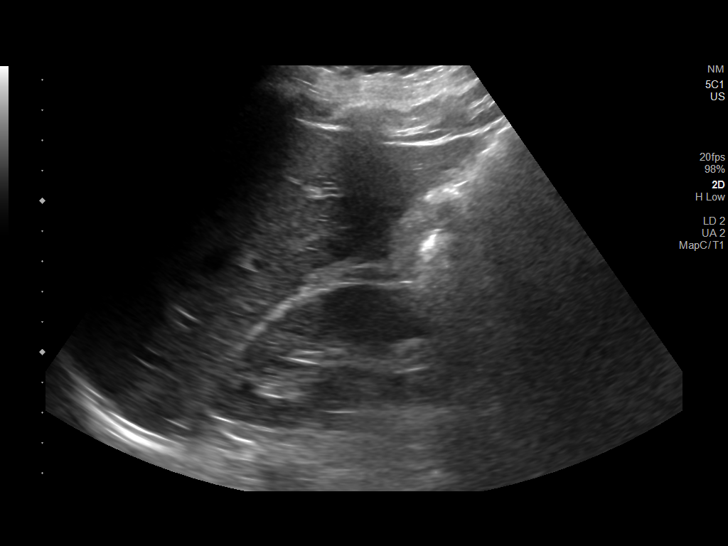
[im 37/60]
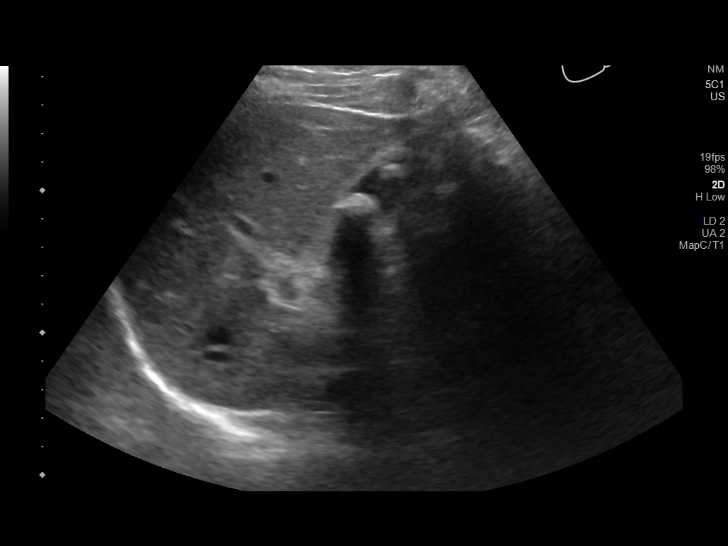
[im 40/60]
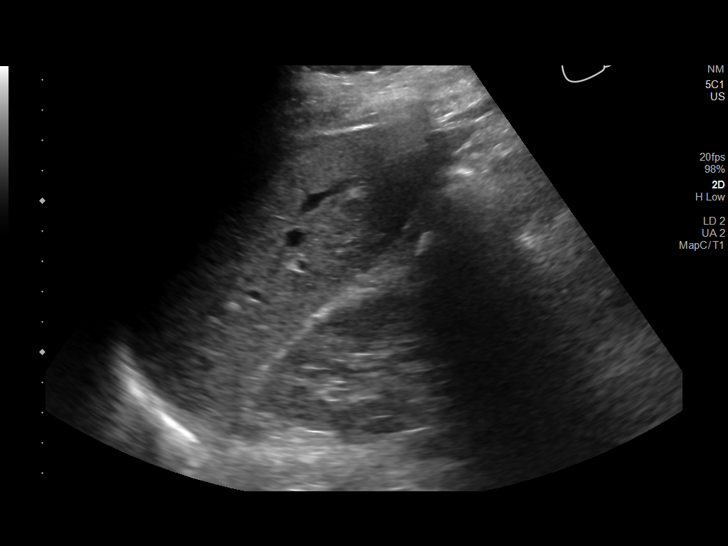
[im 45/60]
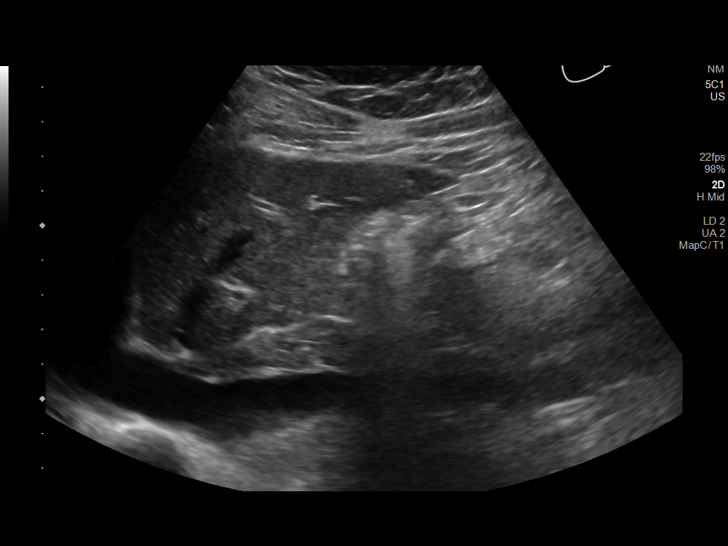
[im 50/60]
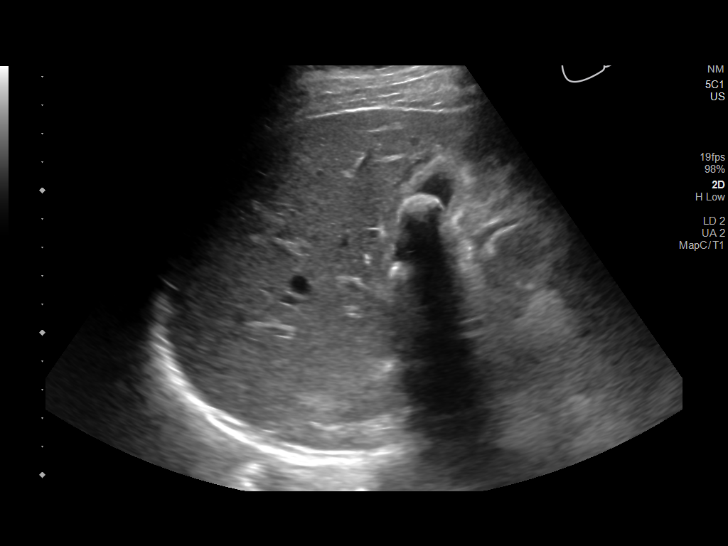
[im 55/60]
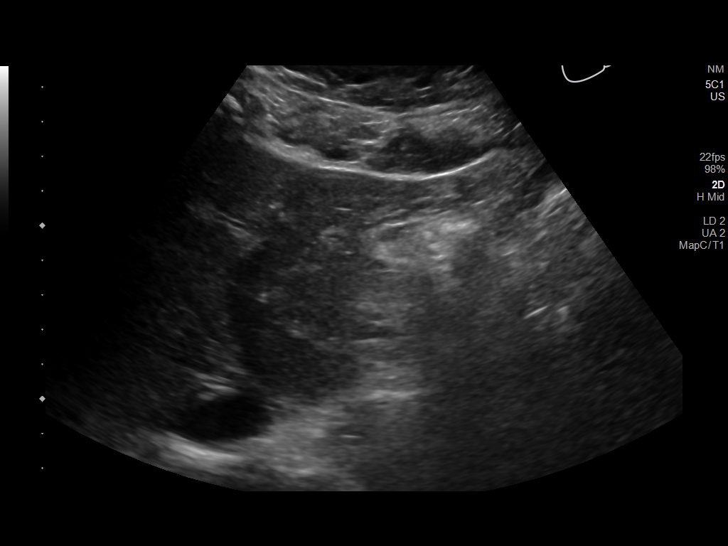
[im 60/60]
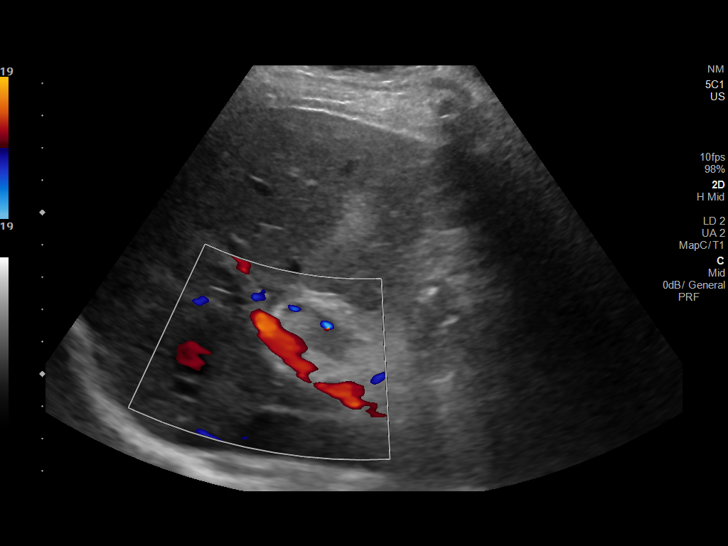

[14 of 25 positions shown; findings below may reference images not displayed]

FINDINGS: Gallbladder:

Numerous shadowing stones measuring up to 1.3 cm. Increased wall
thickness up to 8.7 mm. Negative sonographic Murphy. Edema in the
gallbladder wall versus trace pericholecystic fluid.

Common bile duct:

Diameter: Maximum diameter of 10.2 mm.  No shadowing stones.

Liver:

No focal lesion identified. Within normal limits in parenchymal
echogenicity. Portal vein is patent on color Doppler imaging with
normal direction of blood flow towards the liver.

Other: None.
IMPRESSION: 1. Cholelithiasis with wall thickening and gallbladder wall edema or
small amount of pericholecystic fluid but negative sonographic
Murphy. Findings still raise concern for an acute cholecystitis;
nuclear medicine hepatobiliary imaging could be obtained further
evaluation
2. Dilated common bile duct up to 10.2 mm. If ductal stone is
suspected, MRCP could be obtained.

## 2020-08-12 IMAGING — RF DG ERCP WO/W SPHINCTEROTOMY
1 series · 3 of 3 positions shown · non-contrast
Comparison: Intra op cholangiogram from previous day

CLINICAL DATA: Common bile duct stone removal

EXAM:
ERCP
TECHNIQUE: Multiple spot images obtained with the fluoroscopic device and
submitted for interpretation post-procedure.

[Series 1: unknown protocol · 0.20mm/px · 3 of 3 slices shown]
[im 1/3]
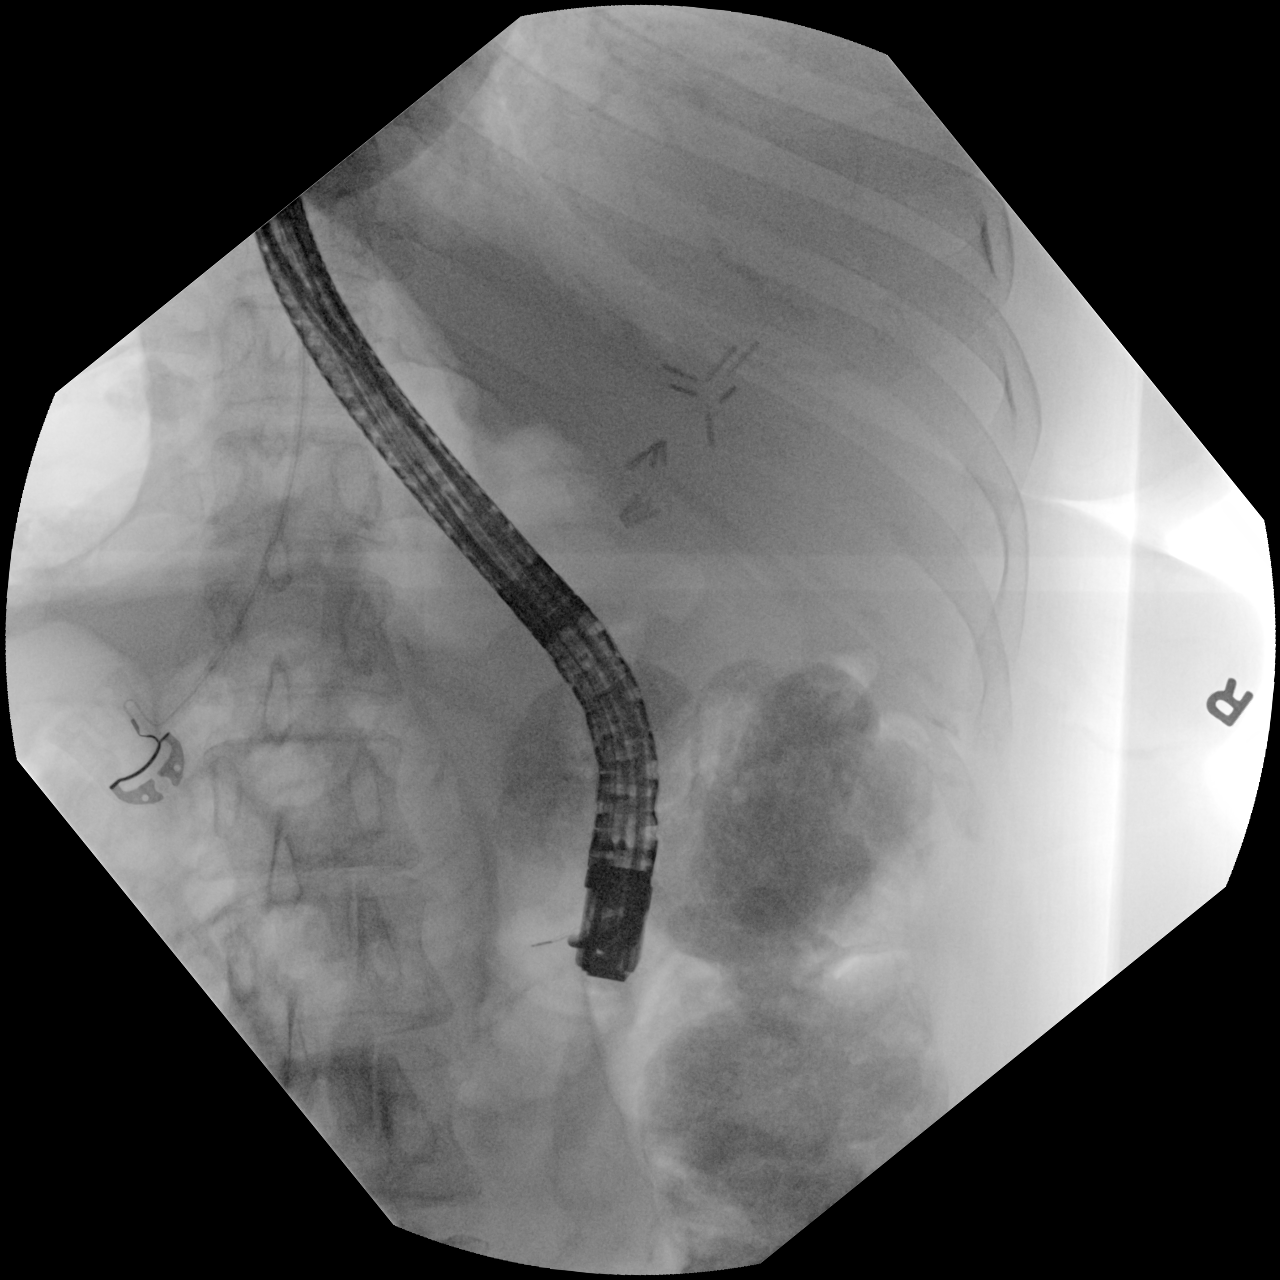
[im 2/3]
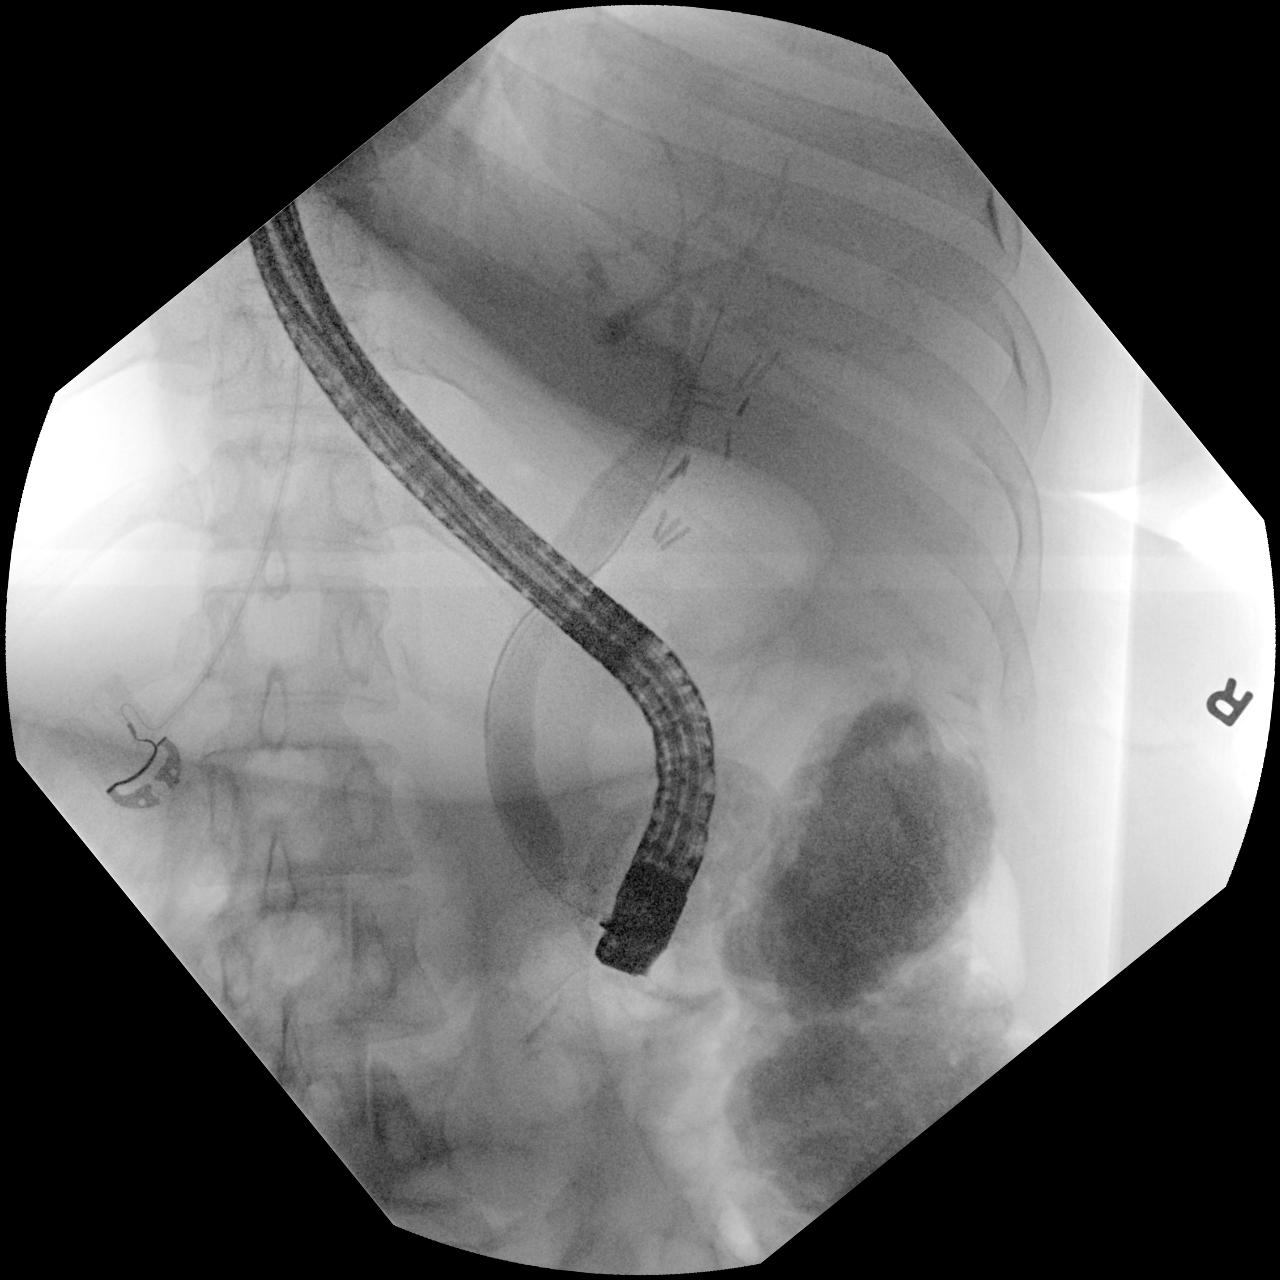
[im 3/3]
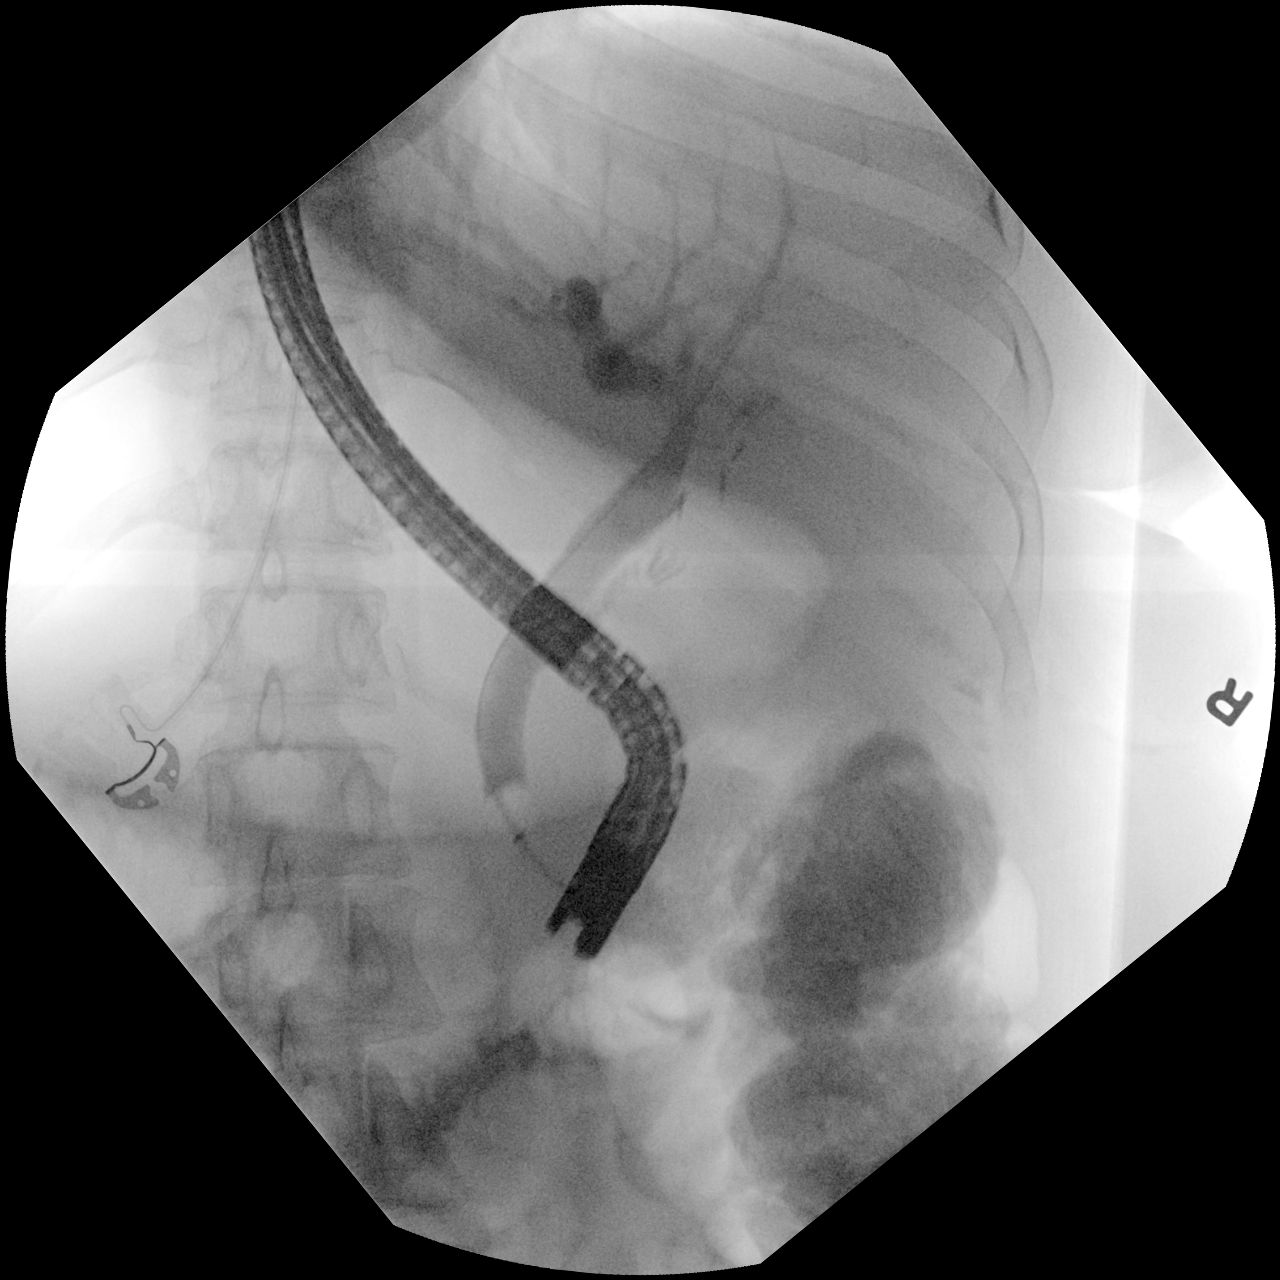

[3 of 3 positions shown; findings below may reference images not displayed]

FINDINGS: A series of fluoroscopic spot images document endoscopic cannulation
and opacification of the CBD with passage of a balloon catheter into
the CBD. Incomplete opacification of the intrahepatic biliary tree,
which appears decompressed centrally. Cholecystectomy clips. No leak
or extravasation identified.
IMPRESSION: Endoscopic CBD cannulation and intervention.

These images were submitted for radiologic interpretation only.
Please see the procedural report for the amount of contrast and the
fluoroscopy time utilized.
